# Patient Record
Sex: Male | Born: 1960 | Race: White | Hispanic: No | Marital: Married | Smoking: Current every day smoker
Health system: Southern US, Community
[De-identification: ages and names within clinical notes are randomized; demographics above are authoritative.]

## PROBLEM LIST (undated history)

## (undated) DIAGNOSIS — K219 Gastro-esophageal reflux disease without esophagitis: Secondary | ICD-10-CM

## (undated) DIAGNOSIS — I4510 Unspecified right bundle-branch block: Secondary | ICD-10-CM

## (undated) DIAGNOSIS — R112 Nausea with vomiting, unspecified: Secondary | ICD-10-CM

## (undated) DIAGNOSIS — G4733 Obstructive sleep apnea (adult) (pediatric): Secondary | ICD-10-CM

## (undated) DIAGNOSIS — E291 Testicular hypofunction: Secondary | ICD-10-CM

## (undated) DIAGNOSIS — J449 Chronic obstructive pulmonary disease, unspecified: Secondary | ICD-10-CM

## (undated) DIAGNOSIS — D126 Benign neoplasm of colon, unspecified: Secondary | ICD-10-CM

## (undated) DIAGNOSIS — M1990 Unspecified osteoarthritis, unspecified site: Secondary | ICD-10-CM

## (undated) DIAGNOSIS — Z833 Family history of diabetes mellitus: Secondary | ICD-10-CM

## (undated) DIAGNOSIS — D1391 Familial adenomatous polyposis: Secondary | ICD-10-CM

## (undated) DIAGNOSIS — N210 Calculus in bladder: Secondary | ICD-10-CM

## (undated) DIAGNOSIS — E119 Type 2 diabetes mellitus without complications: Secondary | ICD-10-CM

## (undated) DIAGNOSIS — E785 Hyperlipidemia, unspecified: Secondary | ICD-10-CM

## (undated) DIAGNOSIS — Z87898 Personal history of other specified conditions: Secondary | ICD-10-CM

## (undated) DIAGNOSIS — F419 Anxiety disorder, unspecified: Secondary | ICD-10-CM

## (undated) DIAGNOSIS — N401 Enlarged prostate with lower urinary tract symptoms: Secondary | ICD-10-CM

## (undated) DIAGNOSIS — G5603 Carpal tunnel syndrome, bilateral upper limbs: Secondary | ICD-10-CM

## (undated) DIAGNOSIS — Z9289 Personal history of other medical treatment: Secondary | ICD-10-CM

## (undated) DIAGNOSIS — K439 Ventral hernia without obstruction or gangrene: Secondary | ICD-10-CM

## (undated) DIAGNOSIS — R3912 Poor urinary stream: Secondary | ICD-10-CM

## (undated) DIAGNOSIS — Z9889 Other specified postprocedural states: Secondary | ICD-10-CM

## (undated) DIAGNOSIS — I2510 Atherosclerotic heart disease of native coronary artery without angina pectoris: Secondary | ICD-10-CM

## (undated) DIAGNOSIS — M720 Palmar fascial fibromatosis [Dupuytren]: Secondary | ICD-10-CM

## (undated) HISTORY — PX: TONSILLECTOMY: SUR1361

## (undated) HISTORY — DX: Ventral hernia without obstruction or gangrene: K43.9

## (undated) HISTORY — PX: SEPTOPLASTY: SHX2393

## (undated) HISTORY — DX: Family history of diabetes mellitus: Z83.3

## (undated) HISTORY — PX: COLON SURGERY: SHX602

## (undated) HISTORY — PX: APPENDECTOMY: SHX54

## (undated) HISTORY — PX: LAPAROSCOPIC SUBTOTAL COLECTOMY: SHX5930

## (undated) HISTORY — PX: CARDIAC CATHETERIZATION: SHX172

## (undated) HISTORY — PX: ROTATOR CUFF REPAIR: SHX139

---

## 1999-01-30 ENCOUNTER — Encounter: Payer: Self-pay | Admitting: Internal Medicine

## 1999-01-30 ENCOUNTER — Ambulatory Visit (HOSPITAL_COMMUNITY): Admission: RE | Admit: 1999-01-30 | Discharge: 1999-01-30 | Payer: Self-pay | Admitting: Internal Medicine

## 1999-03-09 ENCOUNTER — Ambulatory Visit (HOSPITAL_BASED_OUTPATIENT_CLINIC_OR_DEPARTMENT_OTHER): Admission: RE | Admit: 1999-03-09 | Discharge: 1999-03-09 | Payer: Self-pay | Admitting: Otolaryngology

## 1999-12-14 ENCOUNTER — Inpatient Hospital Stay (HOSPITAL_COMMUNITY): Admission: EM | Admit: 1999-12-14 | Discharge: 1999-12-14 | Payer: Self-pay | Admitting: Emergency Medicine

## 1999-12-14 ENCOUNTER — Encounter: Payer: Self-pay | Admitting: Emergency Medicine

## 1999-12-14 ENCOUNTER — Encounter: Payer: Self-pay | Admitting: *Deleted

## 2000-01-29 ENCOUNTER — Other Ambulatory Visit: Admission: RE | Admit: 2000-01-29 | Discharge: 2000-01-29 | Payer: Self-pay | Admitting: Internal Medicine

## 2000-01-29 ENCOUNTER — Encounter (INDEPENDENT_AMBULATORY_CARE_PROVIDER_SITE_OTHER): Payer: Self-pay | Admitting: Specialist

## 2000-09-18 ENCOUNTER — Observation Stay (HOSPITAL_COMMUNITY): Admission: RE | Admit: 2000-09-18 | Discharge: 2000-09-19 | Payer: Self-pay | Admitting: Specialist

## 2002-11-26 ENCOUNTER — Inpatient Hospital Stay (HOSPITAL_COMMUNITY): Admission: RE | Admit: 2002-11-26 | Discharge: 2002-11-27 | Payer: Self-pay | Admitting: Specialist

## 2004-05-16 ENCOUNTER — Ambulatory Visit (HOSPITAL_BASED_OUTPATIENT_CLINIC_OR_DEPARTMENT_OTHER): Admission: RE | Admit: 2004-05-16 | Discharge: 2004-05-16 | Payer: Self-pay | Admitting: Pulmonary Disease

## 2004-06-09 ENCOUNTER — Ambulatory Visit: Payer: Self-pay | Admitting: Pulmonary Disease

## 2004-12-11 ENCOUNTER — Ambulatory Visit: Payer: Self-pay | Admitting: Internal Medicine

## 2004-12-13 ENCOUNTER — Ambulatory Visit: Payer: Self-pay | Admitting: Cardiology

## 2004-12-15 ENCOUNTER — Ambulatory Visit: Payer: Self-pay | Admitting: Internal Medicine

## 2005-01-10 ENCOUNTER — Ambulatory Visit: Payer: Self-pay | Admitting: Internal Medicine

## 2005-01-15 ENCOUNTER — Encounter: Admission: RE | Admit: 2005-01-15 | Discharge: 2005-04-15 | Payer: Self-pay | Admitting: Internal Medicine

## 2005-01-22 ENCOUNTER — Ambulatory Visit: Payer: Self-pay | Admitting: Internal Medicine

## 2005-02-20 ENCOUNTER — Ambulatory Visit: Payer: Self-pay | Admitting: Internal Medicine

## 2005-10-16 ENCOUNTER — Ambulatory Visit: Payer: Self-pay | Admitting: Internal Medicine

## 2007-03-31 ENCOUNTER — Ambulatory Visit: Payer: Self-pay | Admitting: Internal Medicine

## 2007-03-31 LAB — CONVERTED CEMR LAB
ALT: 30 units/L (ref 0–53)
AST: 21 units/L (ref 0–37)
Albumin: 3.9 g/dL (ref 3.5–5.2)
Alkaline Phosphatase: 102 units/L (ref 39–117)
BUN: 17 mg/dL (ref 6–23)
Basophils Absolute: 0 10*3/uL (ref 0.0–0.1)
Basophils Relative: 0.4 % (ref 0.0–1.0)
Bilirubin Urine: NEGATIVE
Bilirubin, Direct: 0.1 mg/dL (ref 0.0–0.3)
CO2: 30 meq/L (ref 19–32)
Calcium: 9.2 mg/dL (ref 8.4–10.5)
Chloride: 105 meq/L (ref 96–112)
Cholesterol: 184 mg/dL (ref 0–200)
Creatinine, Ser: 0.8 mg/dL (ref 0.4–1.5)
Eosinophils Absolute: 0.2 10*3/uL (ref 0.0–0.6)
Eosinophils Relative: 2.2 % (ref 0.0–5.0)
GFR calc Af Amer: 133 mL/min
GFR calc non Af Amer: 110 mL/min
Glucose, Bld: 228 mg/dL — ABNORMAL HIGH (ref 70–99)
HCT: 45.5 % (ref 39.0–52.0)
HDL: 20.5 mg/dL — ABNORMAL LOW (ref >39.0)
Hemoglobin, Urine: NEGATIVE
Hemoglobin: 15.5 g/dL (ref 13.0–17.0)
Ketones, ur: NEGATIVE mg/dL
LDL Cholesterol: 133 mg/dL — ABNORMAL HIGH (ref 0–99)
Leukocytes, UA: NEGATIVE
Lymphocytes Relative: 25.7 % (ref 12.0–46.0)
MCHC: 34 g/dL (ref 30.0–36.0)
MCV: 82.9 fL (ref 78.0–100.0)
Monocytes Absolute: 0.9 10*3/uL — ABNORMAL HIGH (ref 0.2–0.7)
Monocytes Relative: 8.2 % (ref 3.0–11.0)
Neutro Abs: 7.1 10*3/uL (ref 1.4–7.7)
Neutrophils Relative %: 63.5 % (ref 43.0–77.0)
Nitrite: NEGATIVE
PSA: 0.56 ng/mL (ref 0.10–4.00)
Platelets: 235 10*3/uL (ref 150–400)
Potassium: 4.5 meq/L (ref 3.5–5.1)
RBC: 5.49 M/uL (ref 4.22–5.81)
RDW: 12.8 % (ref 11.5–14.6)
Sodium: 140 meq/L (ref 135–145)
Specific Gravity, Urine: 1.025 (ref 1.000–1.03)
TSH: 1.9 microintl units/mL (ref 0.35–5.50)
Total Bilirubin: 0.7 mg/dL (ref 0.3–1.2)
Total CHOL/HDL Ratio: 9
Total Protein, Urine: NEGATIVE mg/dL
Total Protein: 6.9 g/dL (ref 6.0–8.3)
Triglycerides: 155 mg/dL — ABNORMAL HIGH (ref 0–149)
Urine Glucose: >=1000 mg/dL — CR
Urobilinogen, UA: 0.2 (ref 0.0–1.0)
VLDL: 31 mg/dL (ref 0–40)
WBC: 11 10*3/uL — ABNORMAL HIGH (ref 4.5–10.5)
pH: 6 (ref 5.0–8.0)

## 2007-04-08 ENCOUNTER — Ambulatory Visit: Payer: Self-pay | Admitting: Internal Medicine

## 2007-04-11 ENCOUNTER — Ambulatory Visit: Payer: Self-pay | Admitting: Cardiology

## 2007-04-17 ENCOUNTER — Ambulatory Visit: Payer: Self-pay

## 2007-06-11 ENCOUNTER — Encounter: Payer: Self-pay | Admitting: *Deleted

## 2007-06-11 DIAGNOSIS — I2510 Atherosclerotic heart disease of native coronary artery without angina pectoris: Secondary | ICD-10-CM | POA: Insufficient documentation

## 2007-06-11 DIAGNOSIS — Z9889 Other specified postprocedural states: Secondary | ICD-10-CM | POA: Insufficient documentation

## 2007-06-11 DIAGNOSIS — E785 Hyperlipidemia, unspecified: Secondary | ICD-10-CM | POA: Insufficient documentation

## 2007-06-11 DIAGNOSIS — K219 Gastro-esophageal reflux disease without esophagitis: Secondary | ICD-10-CM | POA: Insufficient documentation

## 2007-06-11 DIAGNOSIS — E291 Testicular hypofunction: Secondary | ICD-10-CM | POA: Insufficient documentation

## 2007-06-11 DIAGNOSIS — Z9089 Acquired absence of other organs: Secondary | ICD-10-CM | POA: Insufficient documentation

## 2007-06-11 DIAGNOSIS — Z8719 Personal history of other diseases of the digestive system: Secondary | ICD-10-CM | POA: Insufficient documentation

## 2007-07-07 ENCOUNTER — Ambulatory Visit: Payer: Self-pay | Admitting: Internal Medicine

## 2007-07-07 DIAGNOSIS — E119 Type 2 diabetes mellitus without complications: Secondary | ICD-10-CM | POA: Insufficient documentation

## 2007-09-30 ENCOUNTER — Ambulatory Visit: Payer: Self-pay | Admitting: Internal Medicine

## 2007-09-30 DIAGNOSIS — F17200 Nicotine dependence, unspecified, uncomplicated: Secondary | ICD-10-CM | POA: Insufficient documentation

## 2007-10-01 LAB — CONVERTED CEMR LAB
ALT: 30 units/L (ref 0–53)
AST: 17 units/L (ref 0–37)
Albumin: 3.8 g/dL (ref 3.5–5.2)
Alkaline Phosphatase: 77 units/L (ref 39–117)
BUN: 16 mg/dL (ref 6–23)
Basophils Absolute: 0.1 10*3/uL (ref 0.0–0.1)
Basophils Relative: 0.6 % (ref 0.0–1.0)
Bilirubin, Direct: 0.1 mg/dL (ref 0.0–0.3)
CO2: 28 meq/L (ref 19–32)
Calcium: 8.8 mg/dL (ref 8.4–10.5)
Chloride: 104 meq/L (ref 96–112)
Cholesterol: 144 mg/dL (ref 0–200)
Creatinine, Ser: 0.7 mg/dL (ref 0.4–1.5)
Eosinophils Absolute: 0.2 10*3/uL (ref 0.0–0.6)
Eosinophils Relative: 1.9 % (ref 0.0–5.0)
GFR calc Af Amer: 155 mL/min
GFR calc non Af Amer: 128 mL/min
Glucose, Bld: 168 mg/dL — ABNORMAL HIGH (ref 70–99)
HCT: 44.3 % (ref 39.0–52.0)
HDL: 25 mg/dL — ABNORMAL LOW (ref >39.0)
Hemoglobin: 14.8 g/dL (ref 13.0–17.0)
Hgb A1c MFr Bld: 7.1 % — ABNORMAL HIGH (ref 4.6–6.0)
LDL Cholesterol: 101 mg/dL — ABNORMAL HIGH (ref 0–99)
Lymphocytes Relative: 27.1 % (ref 12.0–46.0)
MCHC: 33.5 g/dL (ref 30.0–36.0)
MCV: 85.2 fL (ref 78.0–100.0)
Monocytes Absolute: 0.6 10*3/uL (ref 0.2–0.7)
Monocytes Relative: 6.4 % (ref 3.0–11.0)
Neutro Abs: 6 10*3/uL (ref 1.4–7.7)
Neutrophils Relative %: 64 % (ref 43.0–77.0)
PSA: 0.49 ng/mL (ref 0.10–4.00)
Platelets: 182 10*3/uL (ref 150–400)
Potassium: 4.6 meq/L (ref 3.5–5.1)
RBC: 5.2 M/uL (ref 4.22–5.81)
RDW: 12.4 % (ref 11.5–14.6)
Sodium: 138 meq/L (ref 135–145)
TSH: 1.49 microintl units/mL (ref 0.35–5.50)
Testosterone: 213.77 ng/dL — ABNORMAL LOW (ref 350.00–890)
Total Bilirubin: 0.5 mg/dL (ref 0.3–1.2)
Total CHOL/HDL Ratio: 5.8
Total Protein: 6.6 g/dL (ref 6.0–8.3)
Triglycerides: 88 mg/dL (ref 0–149)
VLDL: 18 mg/dL (ref 0–40)
WBC: 9.5 10*3/uL (ref 4.5–10.5)

## 2008-03-25 ENCOUNTER — Ambulatory Visit: Payer: Self-pay | Admitting: Internal Medicine

## 2008-03-25 LAB — CONVERTED CEMR LAB
BUN: 17 mg/dL (ref 6–23)
CO2: 28 meq/L (ref 19–32)
GFR calc Af Amer: 116 mL/min
Glucose, Bld: 161 mg/dL — ABNORMAL HIGH (ref 70–99)
Potassium: 4.3 meq/L (ref 3.5–5.1)
Sodium: 139 meq/L (ref 135–145)

## 2008-03-31 ENCOUNTER — Ambulatory Visit: Payer: Self-pay | Admitting: Internal Medicine

## 2008-03-31 DIAGNOSIS — K439 Ventral hernia without obstruction or gangrene: Secondary | ICD-10-CM | POA: Insufficient documentation

## 2008-04-21 ENCOUNTER — Telehealth: Payer: Self-pay | Admitting: Internal Medicine

## 2008-04-29 ENCOUNTER — Telehealth: Payer: Self-pay | Admitting: Internal Medicine

## 2008-04-30 ENCOUNTER — Telehealth: Payer: Self-pay | Admitting: Internal Medicine

## 2008-06-09 ENCOUNTER — Telehealth: Payer: Self-pay | Admitting: Internal Medicine

## 2008-11-04 ENCOUNTER — Encounter: Payer: Self-pay | Admitting: Internal Medicine

## 2009-03-19 DIAGNOSIS — F411 Generalized anxiety disorder: Secondary | ICD-10-CM | POA: Insufficient documentation

## 2009-04-19 ENCOUNTER — Encounter: Payer: Self-pay | Admitting: Internal Medicine

## 2009-07-12 ENCOUNTER — Telehealth (INDEPENDENT_AMBULATORY_CARE_PROVIDER_SITE_OTHER): Payer: Self-pay | Admitting: *Deleted

## 2009-07-22 ENCOUNTER — Telehealth: Payer: Self-pay | Admitting: Internal Medicine

## 2009-07-25 ENCOUNTER — Ambulatory Visit: Payer: Self-pay | Admitting: Internal Medicine

## 2009-07-26 LAB — CONVERTED CEMR LAB
ALT: 24 units/L (ref 0–53)
Alkaline Phosphatase: 81 units/L (ref 39–117)
BUN: 18 mg/dL (ref 6–23)
Basophils Relative: 0.3 % (ref 0.0–3.0)
Bilirubin, Direct: 0.1 mg/dL (ref 0.0–0.3)
Calcium: 9 mg/dL (ref 8.4–10.5)
Cholesterol: 127 mg/dL (ref 0–200)
Creatinine, Ser: 0.9 mg/dL (ref 0.4–1.5)
Eosinophils Relative: 0.9 % (ref 0.0–5.0)
GFR calc non Af Amer: 95.12 mL/min (ref >60)
HDL: 27.2 mg/dL — ABNORMAL LOW (ref >39.00)
Ketones, ur: NEGATIVE mg/dL
LDL Cholesterol: 85 mg/dL (ref 0–99)
Leukocytes, UA: NEGATIVE
Lymphocytes Relative: 31.2 % (ref 12.0–46.0)
MCV: 87.4 fL (ref 78.0–100.0)
Monocytes Absolute: 0.6 10*3/uL (ref 0.1–1.0)
Neutrophils Relative %: 60.8 % (ref 43.0–77.0)
Nitrite: NEGATIVE
PSA: 0.6 ng/mL (ref 0.10–4.00)
Platelets: 177 10*3/uL (ref 150.0–400.0)
RBC: 5.18 M/uL (ref 4.22–5.81)
Specific Gravity, Urine: >=1.03 (ref 1.000–1.030)
Total Bilirubin: 0.9 mg/dL (ref 0.3–1.2)
Total CHOL/HDL Ratio: 5
Total Protein, Urine: NEGATIVE mg/dL
Triglycerides: 76 mg/dL (ref 0.0–149.0)
VLDL: 15.2 mg/dL (ref 0.0–40.0)
WBC: 9.2 10*3/uL (ref 4.5–10.5)
pH: 5 (ref 5.0–8.0)

## 2009-08-02 ENCOUNTER — Ambulatory Visit: Payer: Self-pay | Admitting: Internal Medicine

## 2009-08-13 ENCOUNTER — Encounter: Payer: Self-pay | Admitting: Internal Medicine

## 2009-11-23 ENCOUNTER — Ambulatory Visit: Payer: Self-pay | Admitting: Internal Medicine

## 2009-11-23 LAB — CONVERTED CEMR LAB
Chloride: 106 meq/L (ref 96–112)
GFR calc non Af Amer: 126.96 mL/min (ref >60)
Hgb A1c MFr Bld: 6.9 % — ABNORMAL HIGH (ref 4.6–6.5)
Potassium: 4.5 meq/L (ref 3.5–5.1)
Sodium: 141 meq/L (ref 135–145)

## 2009-11-30 ENCOUNTER — Ambulatory Visit: Payer: Self-pay | Admitting: Internal Medicine

## 2009-11-30 DIAGNOSIS — E669 Obesity, unspecified: Secondary | ICD-10-CM | POA: Insufficient documentation

## 2009-12-21 ENCOUNTER — Encounter: Payer: Self-pay | Admitting: Internal Medicine

## 2010-05-11 ENCOUNTER — Encounter: Admission: RE | Admit: 2010-05-11 | Discharge: 2010-05-11 | Payer: Self-pay | Admitting: Specialist

## 2010-07-03 ENCOUNTER — Telehealth: Payer: Self-pay | Admitting: Internal Medicine

## 2010-07-04 ENCOUNTER — Telehealth (INDEPENDENT_AMBULATORY_CARE_PROVIDER_SITE_OTHER): Payer: Self-pay | Admitting: *Deleted

## 2010-08-02 ENCOUNTER — Ambulatory Visit: Payer: Self-pay | Admitting: Internal Medicine

## 2010-08-09 ENCOUNTER — Other Ambulatory Visit: Payer: Self-pay | Admitting: Internal Medicine

## 2010-08-09 ENCOUNTER — Ambulatory Visit
Admission: RE | Admit: 2010-08-09 | Discharge: 2010-08-09 | Payer: Self-pay | Source: Home / Self Care | Attending: Internal Medicine | Admitting: Internal Medicine

## 2010-08-09 ENCOUNTER — Encounter: Payer: Self-pay | Admitting: Internal Medicine

## 2010-08-09 DIAGNOSIS — D485 Neoplasm of uncertain behavior of skin: Secondary | ICD-10-CM | POA: Insufficient documentation

## 2010-08-09 DIAGNOSIS — J019 Acute sinusitis, unspecified: Secondary | ICD-10-CM | POA: Insufficient documentation

## 2010-08-09 DIAGNOSIS — R0989 Other specified symptoms and signs involving the circulatory and respiratory systems: Secondary | ICD-10-CM | POA: Insufficient documentation

## 2010-08-09 LAB — BASIC METABOLIC PANEL
BUN: 14 mg/dL (ref 6–23)
CO2: 29 mEq/L (ref 19–32)
Calcium: 9 mg/dL (ref 8.4–10.5)
Chloride: 102 mEq/L (ref 96–112)
Creatinine, Ser: 0.6 mg/dL (ref 0.4–1.5)
GFR: 167.21 mL/min (ref >60.00)
Glucose, Bld: 84 mg/dL (ref 70–99)
Potassium: 4.1 mEq/L (ref 3.5–5.1)
Sodium: 137 mEq/L (ref 135–145)

## 2010-08-09 LAB — CBC WITH DIFFERENTIAL/PLATELET
Basophils Absolute: 0 10*3/uL (ref 0.0–0.1)
Basophils Relative: 0.4 % (ref 0.0–3.0)
Eosinophils Absolute: 0.2 10*3/uL (ref 0.0–0.7)
Eosinophils Relative: 1.7 % (ref 0.0–5.0)
HCT: 44.1 % (ref 39.0–52.0)
Hemoglobin: 15.3 g/dL (ref 13.0–17.0)
Lymphocytes Relative: 25.6 % (ref 12.0–46.0)
Lymphs Abs: 3.1 10*3/uL (ref 0.7–4.0)
MCHC: 34.6 g/dL (ref 30.0–36.0)
MCV: 85.2 fl (ref 78.0–100.0)
Monocytes Absolute: 0.8 10*3/uL (ref 0.1–1.0)
Monocytes Relative: 6.5 % (ref 3.0–12.0)
Neutro Abs: 8.1 10*3/uL — ABNORMAL HIGH (ref 1.4–7.7)
Neutrophils Relative %: 65.8 % (ref 43.0–77.0)
Platelets: 235 10*3/uL (ref 150.0–400.0)
RBC: 5.18 Mil/uL (ref 4.22–5.81)
RDW: 13.1 % (ref 11.5–14.6)
WBC: 12.3 10*3/uL — ABNORMAL HIGH (ref 4.5–10.5)

## 2010-08-09 LAB — HEMOGLOBIN A1C: Hgb A1c MFr Bld: 6.5 % (ref 4.6–6.5)

## 2010-08-09 LAB — URINALYSIS
Bilirubin Urine: NEGATIVE
Hemoglobin, Urine: NEGATIVE
Ketones, ur: NEGATIVE
Leukocytes, UA: NEGATIVE
Nitrite: NEGATIVE
Specific Gravity, Urine: 1.02 (ref 1.000–1.030)
Total Protein, Urine: NEGATIVE
Urine Glucose: NEGATIVE
Urobilinogen, UA: 0.2 (ref 0.0–1.0)
pH: 6 (ref 5.0–8.0)

## 2010-08-09 LAB — VITAMIN B12: Vitamin B-12: 359 pg/mL (ref 211–911)

## 2010-08-09 LAB — TSH: TSH: 1.74 u[IU]/mL (ref 0.35–5.50)

## 2010-08-09 LAB — HEPATIC FUNCTION PANEL
ALT: 24 U/L (ref 0–53)
AST: 18 U/L (ref 0–37)
Albumin: 4.2 g/dL (ref 3.5–5.2)
Alkaline Phosphatase: 92 U/L (ref 39–117)
Bilirubin, Direct: 0.1 mg/dL (ref 0.0–0.3)
Total Bilirubin: 0.6 mg/dL (ref 0.3–1.2)
Total Protein: 7.2 g/dL (ref 6.0–8.3)

## 2010-08-09 LAB — LIPID PANEL
Cholesterol: 188 mg/dL (ref 0–200)
HDL: 27.5 mg/dL — ABNORMAL LOW (ref >39.00)
LDL Cholesterol: 134 mg/dL — ABNORMAL HIGH (ref 0–99)
Total CHOL/HDL Ratio: 7
Triglycerides: 131 mg/dL (ref 0.0–149.0)
VLDL: 26.2 mg/dL (ref 0.0–40.0)

## 2010-08-09 LAB — PSA: PSA: 0.8 ng/mL (ref 0.10–4.00)

## 2010-08-11 ENCOUNTER — Encounter (INDEPENDENT_AMBULATORY_CARE_PROVIDER_SITE_OTHER): Payer: Self-pay | Admitting: *Deleted

## 2010-08-17 ENCOUNTER — Encounter: Payer: Self-pay | Admitting: Internal Medicine

## 2010-08-18 ENCOUNTER — Ambulatory Visit: Admission: RE | Admit: 2010-08-18 | Discharge: 2010-08-18 | Payer: Self-pay | Source: Home / Self Care

## 2010-08-18 ENCOUNTER — Encounter: Payer: Self-pay | Admitting: Internal Medicine

## 2010-08-30 ENCOUNTER — Other Ambulatory Visit: Payer: Self-pay | Admitting: Internal Medicine

## 2010-08-30 ENCOUNTER — Ambulatory Visit
Admission: RE | Admit: 2010-08-30 | Discharge: 2010-08-30 | Payer: Self-pay | Source: Home / Self Care | Attending: Internal Medicine | Admitting: Internal Medicine

## 2010-08-30 DIAGNOSIS — H6690 Otitis media, unspecified, unspecified ear: Secondary | ICD-10-CM | POA: Insufficient documentation

## 2010-09-05 NOTE — Letter (Signed)
Summary: [facility]   Imported By: Lester [place] 08/25/2009 07:29:20  _____________________________________________________________________  External Attachment:    Type:   Image     Comment:   External Document

## 2010-09-05 NOTE — Assessment & Plan Note (Signed)
Summary: 4 MTH FU--STC   Vital Signs:  Patient profile:   50 year old male Height:      71 inches Weight:      203.50 pounds BMI:     28.49 O2 Sat:      95 % on Room air Temp:     98.4 degrees F oral Pulse rate:   63 / minute BP sitting:   122 / 64  (left arm) Cuff size:   regular  Vitals Entered By: Lucious Groves (November 30, 2009 4:49 PM)  O2 Flow:  Room air  History of Present Illness: The patient presents for a follow up of hypertension, diabetes, hyperlipidemia Loosing wt on [employer] program: lost 16 lbs  Current Medications (verified): 1)  Lorazepam 1 Mg  Tabs (Lorazepam) .... Take 2 Tablets When Needed 2)  Metformin Hcl 500 Mg  Tabs (Metformin Hcl) .... Take One Tablet Twice Daily 3)  Lovastatin 40 Mg Tabs (Lovastatin) .Marland Kitchen.. 1 Po Qd 4)  Aspirin 81 Mg  Tabs (Aspirin) .... Take One Tablet Once Daily 5)  Ranitidine Hcl 300 Mg Tabs (Ranitidine Hcl) .... Take 1 Tablet By Mouth Once A Day 6)  Terazosin Hcl 2 Mg Caps (Terazosin Hcl) .Marland Kitchen.. 1 By Mouth At Bedtime For Prostate 7)  Chantix Starting Month Pak 0.5 Mg X 11 & 1 Mg X 42 Tabs (Varenicline Tartrate) .... As Dirrected 8)  Chantix Continuing Month Pak 1 Mg Tabs (Varenicline Tartrate) .... As Dirrected 9)  Mucinex (Otc) .Marland Kitchen.. 1 - 2 By Mouth Daily  Allergies (verified): 1)  ! Codeine 2)  ! * Ct Contrast  Past History:  Past Medical History: Last updated: 03/19/2009 ANXIETY (ICD-300.00) UNSPEC VENTRAL HERN W/O MENTION OBST/GANGREN (ICD-553.20) DIABETES MELLITUS, TYPE II (ICD-250.00) CORONARY ARTERY DISEASE (ICD-414.00) GERD (ICD-530.81) BARRETT'S ESOPHAGUS, HX OF (ICD-V12.79) HYPERLIPIDEMIA (ICD-272.4) HYPOGONADISM (ICD-257.2) FAMILY HISTORY DIABETES 1ST DEGREE RELATIVE (ICD-V18.0) TOBACCO USE DISORDER/SMOKER-SMOKING CESSATION DISCUSSED (ICD-305.1)    Social History: Last updated: 08/02/2009 Occupation: school system Married Current Smoker  Review of Systems  The patient denies fever, chest pain, syncope,  dyspnea on exertion, prolonged cough, and headaches.         Better  Physical Exam  General:  well-developed and less overweight-appearing.   Nose:  External nasal examination shows no deformity or inflammation. Nasal mucosa are pink and moist without lesions or exudates. Mouth:  Oral mucosa and oropharynx without lesions or exudates.  Teeth in good repair. Lungs:  Normal respiratory effort, chest expands symmetrically. Lungs are clear to auscultation, no crackles or wheezes. Heart:  Normal rate and regular rhythm. S1 and S2 normal without gallop, murmur, click, rub or other extra sounds. Abdomen:  Upper midline hernia Msk:  No deformity or scoliosis noted of thoracic or lumbar spine.   Neurologic:  No cranial nerve deficits noted. Station and gait are normal. Plantar reflexes are down-going bilaterally. DTRs are symmetrical throughout. Sensory, motor and coordinative functions appear intact. Skin:  Intact without suspicious lesions or rashes Psych:  Cognition and judgment appear intact. Alert and cooperative with normal attention span and concentration. No apparent delusions, illusions, hallucinations   Impression & Recommendations:  Problem # 1:  DIABETES MELLITUS, TYPE II (ICD-250.00) Assessment Improved  His updated medication list for this problem includes:    Metformin Hcl 500 Mg Tabs (Metformin hcl) .Marland Kitchen... Take one tablet twice daily    Aspirin 81 Mg Tabs (Aspirin) .Marland Kitchen... Take one tablet once daily  Problem # 2:  CORONARY ARTERY DISEASE (ICD-414.00) Assessment: Unchanged  His  updated medication list for this problem includes:    Aspirin 81 Mg Tabs (Aspirin) .Marland Kitchen... Take one tablet once daily    Terazosin Hcl 2 Mg Caps (Terazosin hcl) .Marland Kitchen... 1 by mouth at bedtime for prostate  Problem # 3:  HYPERLIPIDEMIA (ICD-272.4) Assessment: Improved  His updated medication list for this problem includes:    Lovastatin 40 Mg Tabs (Lovastatin) .Marland Kitchen... 1 po qd  Problem # 4:  ANXIETY  (ICD-300.00) Assessment: Improved  His updated medication list for this problem includes:    Lorazepam 1 Mg Tabs (Lorazepam) .Marland Kitchen... Take 2 tablets when needed  Problem # 5:  OBESITY (ICD-278.00) Assessment: Improved On diet  Complete Medication List: 1)  Lorazepam 1 Mg Tabs (Lorazepam) .... Take 2 tablets when needed 2)  Metformin Hcl 500 Mg Tabs (Metformin hcl) .... Take one tablet twice daily 3)  Lovastatin 40 Mg Tabs (Lovastatin) .Marland Kitchen.. 1 po qd 4)  Aspirin 81 Mg Tabs (Aspirin) .... Take one tablet once daily 5)  Ranitidine Hcl 300 Mg Tabs (Ranitidine hcl) .... Take 1 tablet by mouth once a day 6)  Terazosin Hcl 2 Mg Caps (Terazosin hcl) .Marland Kitchen.. 1 by mouth at bedtime for prostate 7)  Chantix Starting Month Pak 0.5 Mg X 11 & 1 Mg X 42 Tabs (Varenicline tartrate) .... As dirrected 8)  Chantix Continuing Month Pak 1 Mg Tabs (Varenicline tartrate) .... As dirrected 9)  Mucinex (otc)  .Marland Kitchen.. 1 - 2 by mouth daily  Patient Instructions: 1)  Please schedule a follow-up appointment in 4 months. 2)  BMP prior to visit, ICD-9: 3)  Hepatic Panel prior to visit, ICD-9: 4)  TSH prior to visit, ICD-9: 250.00 414.8 5)  CBC w/ Diff prior to visit, ICD-9: 6)  HbgA1C prior to visit, ICD-9:

## 2010-09-05 NOTE — Progress Notes (Signed)
Summary: RESCHEDULE APT  Phone Note Call from Patient Call back at Work Phone [phone]   Summary of Call: Pt left vm req labs prior to next apt which he thinks is a physical. Please help reschedule pt b/c he is due for cpx w/labs prior. Last cpx was 08/02/09. Thank you Initial call taken by: Lamar Sprinkles, CMA,  July 04, 2010 5:24 PM  Follow-up for Phone Call        Appt set up for Jan 2012 for CPX. Follow-up by: Verdell Face,  July 05, 2010 9:48 AM

## 2010-09-05 NOTE — Letter (Signed)
Summary: Tobacco Cessation [facility]  Tobacco Cessation [facility]   Imported By: Sherian Rein 12/26/2009 08:06:25  _____________________________________________________________________  External Attachment:    Type:   Image     Comment:   External Document

## 2010-09-05 NOTE — Progress Notes (Signed)
Summary: Rf Lorazepam  Phone Note Refill Request Message from:  Fax from Pharmacy  Refills Requested: Medication #1:  LORAZEPAM 1 MG  TABS Take 2 tablets when needed   Dosage confirmed as above?Dosage Confirmed   Supply Requested: 60   Last Refilled: 12/22/2009  Method Requested: Telephone to Pharmacy Next Appointment Scheduled: none Initial call taken by: Lanier Prude, [facility]),  July 03, 2010 10:59 AM  Follow-up for Phone Call        ok 3 ref Follow-up by: Tresa Garter MD,  July 03, 2010 5:58 PM    Prescriptions: LORAZEPAM 1 MG  TABS (LORAZEPAM) Take 2 tablets when needed  #60 x 3   Entered by:   Lamar Sprinkles, CMA   Authorized by:   Tresa Garter MD   Signed by:   Lamar Sprinkles, CMA on 07/03/2010   Method used:   Telephoned to ...       [facility]* (retail)       [address] [place], [place]  [address]       Ph: [phone] or [phone]       Fax: [phone]   RxID:   [id number]

## 2010-09-07 NOTE — Assessment & Plan Note (Signed)
Summary: SKIN BX/NWS  #   Vital Signs:  Patient profile:   50 year old male Height:      71 inches Weight:      209 pounds BMI:     29.25 O2 Sat:      96 % on Room air Temp:     98.6 degrees F oral Pulse rate:   69 / minute BP sitting:   122 / 82  (left arm) Cuff size:   regular  Vitals Entered By: Bill Salinas CMA (August 30, 2010 2:57 PM)  O2 Flow:  Room air  Procedure Note  Mole Biopsy/Removal: The patient complains of changing mole. Indication: suspicious lesion Consent signed: yes  Procedure # 1: shave biopsy    Size (in cm): 0.4 x 0.5    Region: lateral    Location: L temple    Comment: Risks including but not limited by incomplete procedure, bleeding, infection, recurrence were discussed with the patient. Consent form was signed. Tolerated well. Complicatons - none.     Instrument used: dermablade    Anesthesia: 0.5 ml 1% lidocaine w/epinephrine  Procedure # 2: shave biopsy    Size (in cm): 0.6 x 0.4    Region: lateral    Location: L dist thigh    Comment: as above    Instrument used: same    Anesthesia: 0.5 ml 1% lidocaine w/epinephrine  Cleaned and prepped with: alcohol and betadine Wound dressing: neosporin and bandaid Instructions: daily dressing changes Additional Instructions: Two lesions in L axilla were removed identically at no charge and discarded (tags)  CC: pt here to have moles on his face arm and knee looked at/ ab   CC:  pt here to have moles on his face arm and knee looked at/ ab.  History of Present Illness: Skin biopsy  C/o L earache new-  sinuses are better after abx; but  the ear still feels like underwater  Current Medications (verified): 1)  Lorazepam 1 Mg  Tabs (Lorazepam) .... Take 2 Tablets When Needed 2)  Metformin Hcl 500 Mg  Tabs (Metformin Hcl) .... Take One Tablet Twice Daily 3)  Lovastatin 40 Mg Tabs (Lovastatin) .Marland Kitchen.. 1 Po Qd 4)  Aspirin 81 Mg  Tabs (Aspirin) .... Take One Tablet Once Daily 5)  Ranitidine Hcl 300 Mg  Tabs (Ranitidine Hcl) .... Take 1 Tablet By Mouth Once A Day 6)  Terazosin Hcl 2 Mg Caps (Terazosin Hcl) .Marland Kitchen.. 1 By Mouth At Bedtime For Prostate 7)  Chantix Starting Month Pak 0.5 Mg X 11 & 1 Mg X 42 Tabs (Varenicline Tartrate) .... As Dirrected 8)  Chantix Continuing Month Pak 1 Mg Tabs (Varenicline Tartrate) .... As Dirrected 9)  Mucinex (Otc) .Marland Kitchen.. 1 - 2 By Mouth Daily 10)  Flonase 50 Mcg/act Susp (Fluticasone Propionate) .Marland Kitchen.. 1 Spr Each Nostr Qd As Needed  Allergies (verified): 1)  ! Codeine 2)  ! * Ct Contrast  Past History:  Past Medical History: Last updated: 08/09/2010 ANXIETY (ICD-300.00) UNSPEC VENTRAL HERN W/O MENTION OBST/GANGREN (ICD-553.20) DIABETES MELLITUS, TYPE II (ICD-250.00) CORONARY ARTERY DISEASE (ICD-414.00) Dr Jens Som GERD (ICD-530.81) BARRETT'S ESOPHAGUS, HX OF (ICD-V12.79) HYPERLIPIDEMIA (ICD-272.4) HYPOGONADISM (ICD-257.2) FAMILY HISTORY DIABETES 1ST DEGREE RELATIVE (ICD-V18.0) TOBACCO USE DISORDER/SMOKER-SMOKING CESSATION DISCUSSED (ICD-305.1)    Review of Systems  The patient denies fever.    Physical Exam  General:  well-developed and less overweight-appearing.   Ears:  L TM retracted Mouth:  WNL Lungs:  CTA Heart:  RRR Skin:  4 mm mole L  temple 6x4 mm mole L thigh lat dist 2 growths in L axilla 3x6 mm and 2x3 mm    Impression & Recommendations:  Problem # 1:  NEOPLASM OF UNCERTAIN BEHAVIOR OF SKIN (ICD-238.2) Assessment New  Orders: Shave Skin Lesion < 0.5cm face/ears/eyelids/nose/lips/mm (11310) Shave Skin Lesion 0.6-1.0 cm/trunk/arm/leg (11301)  Problem # 2:  OTITIS MEDIA (ICD-382.9) L Assessment: New  His updated medication list for this problem includes:    Aspirin 81 Mg Tabs (Aspirin) .Marland Kitchen... Take one tablet once daily    Augmentin 875-125 Mg Tabs (Amoxicillin-pot clavulanate) .Marland Kitchen... 1 by mouth bid  Complete Medication List: 1)  Lorazepam 1 Mg Tabs (Lorazepam) .... Take 2 tablets when needed 2)  Metformin Hcl 500 Mg Tabs  (Metformin hcl) .... Take one tablet twice daily 3)  Lovastatin 40 Mg Tabs (Lovastatin) .Marland Kitchen.. 1 po qd 4)  Aspirin 81 Mg Tabs (Aspirin) .... Take one tablet once daily 5)  Ranitidine Hcl 300 Mg Tabs (Ranitidine hcl) .... Take 1 tablet by mouth once a day 6)  Terazosin Hcl 2 Mg Caps (Terazosin hcl) .Marland Kitchen.. 1 by mouth at bedtime for prostate 7)  Chantix Starting Month Pak 0.5 Mg X 11 & 1 Mg X 42 Tabs (Varenicline tartrate) .... As dirrected 8)  Chantix Continuing Month Pak 1 Mg Tabs (Varenicline tartrate) .... As dirrected 9)  Mucinex (otc)  .Marland Kitchen.. 1 - 2 by mouth daily 10)  Flonase 50 Mcg/act Susp (Fluticasone propionate) .Marland Kitchen.. 1 spr each nostr qd as needed 11)  Augmentin 875-125 Mg Tabs (Amoxicillin-pot clavulanate) .Marland Kitchen.. 1 by mouth bid Prescriptions: AUGMENTIN 875-125 MG TABS (AMOXICILLIN-POT CLAVULANATE) 1 by mouth bid  #28 x 0   Entered and Authorized by:   Tresa Garter MD   Signed by:   Tresa Garter MD on 08/30/2010   Method used:   Electronically to        [facility]* (retail)       [address] [place], [place]  [address]       Ph: [phone] or [phone]       Fax: [phone]   RxID:   [id number]    Orders Added: 1)  Shave Skin Lesion < 0.5cm face/ears/eyelids/nose/lips/mm [11310] 2)  Shave Skin Lesion 0.6-1.0 cm/trunk/arm/leg [11301] 3)  Est. Patient Level III [42595]

## 2010-09-07 NOTE — Assessment & Plan Note (Signed)
Summary: cpx-lb   Vital Signs:  Patient profile:   50 year old male Height:      71 inches Weight:      204 pounds BMI:     28.56 Pulse rate:   72 / minute Pulse rhythm:   regular Resp:     16 per minute BP sitting:   120 / 80  (left arm) Cuff size:   regular  Vitals Entered By: Lanier Prude, CMA(AAMA) (August 09, 2010 3:06 PM) CC: CPX Is Patient Diabetic? Yes   CC:  CPX.  History of Present Illness: The patient presents for a preventive health examination  C/o L ear pain x 1 month - it pops. He just finished Augmentin x 10 d  Current Medications (verified): 1)  Lorazepam 1 Mg  Tabs (Lorazepam) .... Take 2 Tablets When Needed 2)  Metformin Hcl 500 Mg  Tabs (Metformin Hcl) .... Take One Tablet Twice Daily 3)  Lovastatin 40 Mg Tabs (Lovastatin) .Marland Kitchen.. 1 Po Qd 4)  Aspirin 81 Mg  Tabs (Aspirin) .... Take One Tablet Once Daily 5)  Ranitidine Hcl 300 Mg Tabs (Ranitidine Hcl) .... Take 1 Tablet By Mouth Once A Day 6)  Terazosin Hcl 2 Mg Caps (Terazosin Hcl) .Marland Kitchen.. 1 By Mouth At Bedtime For Prostate 7)  Chantix Starting Month Pak 0.5 Mg X 11 & 1 Mg X 42 Tabs (Varenicline Tartrate) .... As Dirrected 8)  Chantix Continuing Month Pak 1 Mg Tabs (Varenicline Tartrate) .... As Dirrected 9)  Mucinex (Otc) .Marland Kitchen.. 1 - 2 By Mouth Daily  Allergies (verified): 1)  ! Codeine 2)  ! * Ct Contrast  Past History:  Past Surgical History: Last updated: 06/11/2007 * SEPTOPLASTY APPENDECTOMY, HX OF (ICD-V45.79) TONSILLECTOMY, HX OF (ICD-V45.79) ROTATOR CUFF REPAIR, RIGHT, HX OF (ICD-V45.89) ROTATOR CUFF REPAIR, LEFT, HX OF (ICD-V45.89)  Family History: Last updated: 07/07/2007 Family History Diabetes 1st degree relative  Social History: Last updated: 08/02/2009 Occupation: school system Married Current Smoker  Past Medical History: ANXIETY (ICD-300.00) UNSPEC VENTRAL HERN W/O MENTION OBST/GANGREN (ICD-553.20) DIABETES MELLITUS, TYPE II (ICD-250.00) CORONARY ARTERY DISEASE  (ICD-414.00) Dr Jens Som GERD (ICD-530.81) BARRETT'S ESOPHAGUS, HX OF (ICD-V12.79) HYPERLIPIDEMIA (ICD-272.4) HYPOGONADISM (ICD-257.2) FAMILY HISTORY DIABETES 1ST DEGREE RELATIVE (ICD-V18.0) TOBACCO USE DISORDER/SMOKER-SMOKING CESSATION DISCUSSED (ICD-305.1)    Review of Systems  The patient denies anorexia, fever, weight loss, weight gain, vision loss, decreased hearing, hoarseness, chest pain, syncope, dyspnea on exertion, peripheral edema, prolonged cough, headaches, hemoptysis, abdominal pain, melena, hematochezia, severe indigestion/heartburn, hematuria, incontinence, genital sores, muscle weakness, suspicious skin lesions, transient blindness, difficulty walking, depression, unusual weight change, abnormal bleeding, enlarged lymph nodes, angioedema, and testicular masses.         on [employer]  Physical Exam  General:  well-developed and less overweight-appearing.   Head:  Normocephalic and atraumatic without obvious abnormalities. No apparent alopecia or balding. Eyes:  No corneal or conjunctival inflammation noted. EOMI. Perrla Ears:  External ear exam shows no significant lesions or deformities.  Otoscopic examination reveals clear canals, tympanic membranes are intact bilaterally without bulging, retraction, inflammation or discharge. Hearing is grossly normal bilaterally. Nose:  External nasal examination shows no deformity or inflammation. Nasal mucosa are pink and moist without lesions or exudates. Mouth:  Oral mucosa and oropharynx without lesions or exudates.  Teeth in good repair. Neck:  No deformities, masses, or tenderness noted. Lungs:  Normal respiratory effort, chest expands symmetrically. Lungs are clear to auscultation, no crackles or wheezes. Heart:  Normal rate and regular rhythm. S1 and S2  normal without gallop, murmur, click, rub or other extra sounds. Abdomen:  Upper midline hernia Rectal:  per GI Prostate:  per GI Msk:  No deformity or scoliosis noted of  thoracic or lumbar spine.   Pulses:  R and L carotid,radial,femoral,dorsalis pedis and posterior tibial pulses are full and equal bilaterally Extremities:  No clubbing, cyanosis, edema, or deformity noted with normal full range of motion of all joints.   Neurologic:  No cranial nerve deficits noted. Station and gait are normal. Plantar reflexes are down-going bilaterally. DTRs are symmetrical throughout. Sensory, motor and coordinative functions appear intact. Skin:  L temple mole 6 mm Cervical Nodes:  No lymphadenopathy noted Inguinal Nodes:  No significant adenopathy Psych:  Cognition and judgment appear intact. Alert and cooperative with normal attention span and concentration. No apparent delusions, illusions, hallucinations   Impression & Recommendations:  Problem # 1:  PHYSICAL EXAMINATION (ICD-V70.0) Assessment New Health and age related issues were discussed. Available screening tests and vaccinations were discussed as well. Healthy life style including good diet and exercise was discussed.  Labs ordered Orders: EKG w/ Interpretation (93000) TLB-BMP (Basic Metabolic Panel-BMET) (80048-METABOL) TLB-CBC Platelet - w/Differential (85025-CBCD) TLB-Hepatic/Liver Function Pnl (80076-HEPATIC) TLB-Lipid Panel (80061-LIPID) TLB-TSH (Thyroid Stimulating Hormone) (84443-TSH) TLB-PSA (Prostate Specific Antigen) (84153-PSA) TLB-Udip ONLY (81003-UDIP)  Problem # 2:  DIABETES MELLITUS, TYPE II (ICD-250.00) Assessment: Improved  His updated medication list for this problem includes:    Metformin Hcl 500 Mg Tabs (Metformin hcl) .Marland Kitchen... Take one tablet twice daily    Aspirin 81 Mg Tabs (Aspirin) .Marland Kitchen... Take one tablet once daily  Orders: TLB-A1C / Hgb A1C (Glycohemoglobin) (83036-A1C) TLB-B12, Serum-Total ONLY ([address]-U04)  Problem # 3:  OBESITY (ICD-278.00) Assessment: Improved on diet  Problem # 4:  CORONARY ARTERY DISEASE (ICD-414.00) Assessment: Unchanged  His updated medication  list for this problem includes:    Aspirin 81 Mg Tabs (Aspirin) .Marland Kitchen... Take one tablet once daily    Terazosin Hcl 2 Mg Caps (Terazosin hcl) .Marland Kitchen... 1 by mouth at bedtime for prostate  Orders: TLB-B12, Serum-Total ONLY (54098-J19)  Problem # 5:  CAROTID BRUIT (ICD-785.9) R Assessment: New  Orders: Doppler Referral (Doppler) Gastroenterology Referral (GI)  Problem # 6:  TOBACCO USE DISORDER/SMOKER-SMOKING CESSATION DISCUSSED (ICD-305.1) Assessment: Unchanged  His updated medication list for this problem includes:    Chantix Starting Month Pak 0.5 Mg X 11 & 1 Mg X 42 Tabs (Varenicline tartrate) .Marland Kitchen... As dirrected    Chantix Continuing Month Pak 1 Mg Tabs (Varenicline tartrate) .Marland Kitchen... As dirrected  Encouraged smoking cessation and discussed different methods for smoking cessation.   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 Take 2 tablets when needed 2)  Metformin Hcl 500 Mg Tabs (Metformin hcl) .... Take one tablet twice daily 3)  Lovastatin 40 Mg Tabs (Lovastatin) .Marland Kitchen.. 1 po qd 4)  Aspirin 81 Mg Tabs (Aspirin) .... Take one tablet once daily 5)  Ranitidine Hcl 300 Mg Tabs (Ranitidine hcl) .... Take 1 tablet by mouth once a day 6)  Terazosin Hcl 2 Mg Caps (Terazosin hcl) .Marland Kitchen.. 1 by mouth at bedtime for prostate 7)  Chantix Starting Month Pak 0.5 Mg X 11 & 1 Mg X 42 Tabs (Varenicline tartrate) .... As dirrected 8)  Chantix Continuing Month Pak 1 Mg Tabs (Varenicline tartrate) .... As dirrected 9)  Mucinex (otc)  .Marland Kitchen.. 1 - 2 by mouth  daily 10)  Augmentin 875-125 Mg Tabs (Amoxicillin-pot clavulanate) .Marland Kitchen.. 1 by mouth bid 11)  Flonase 50 Mcg/act Susp (Fluticasone propionate) .Marland Kitchen.. 1 spr each nostr qd as needed  Patient Instructions: 1)  Use the Sinus rinse as needed  2)  Please schedule a follow-up appointment in 4 months. 3)  BMP prior to visit,  ICD-9: 4)  HbgA1C prior to visit, ICD-9:250.00 Prescriptions: FLONASE 50 MCG/ACT SUSP (FLUTICASONE PROPIONATE) 1 spr each nostr qd as needed  #3 x 3   Entered and Authorized by:   Tresa Garter MD   Signed by:   Tresa Garter MD on 08/09/2010   Method used:   Electronically to        [facility]* (retail)       [address] [place], [place]  [address]       Ph: [phone] or [phone]       Fax: [phone]   RxID:   [id number] AUGMENTIN 875-125 MG TABS (AMOXICILLIN-POT CLAVULANATE) 1 by mouth bid  #20 x 0   Entered and Authorized by:   Tresa Garter MD   Signed by:   Tresa Garter MD on 08/09/2010   Method used:   Electronically to        [facility]* (retail)       [address] [place], [place]  [address]       Ph: [phone] or [phone]       Fax: [phone]   RxID:   [id number]    Orders Added: 1)  EKG w/ Interpretation [93000] 2)  TLB-BMP (Basic Metabolic Panel-BMET) [80048-METABOL] 3)  TLB-CBC Platelet - w/Differential [85025-CBCD] 4)  TLB-Hepatic/Liver Function Pnl [80076-HEPATIC] 5)  TLB-Lipid Panel [80061-LIPID] 6)  TLB-TSH (Thyroid Stimulating Hormone) [84443-TSH] 7)  TLB-PSA (Prostate Specific Antigen) [84153-PSA] 8)  TLB-Udip ONLY [81003-UDIP] 9)  TLB-A1C / Hgb A1C (Glycohemoglobin) [83036-A1C] 10)  TLB-B12, Serum-Total ONLY [82607-B12] 11)  Gastroenterology Referral [GI] 12)  Doppler Referral [Doppler] 13)  Est. Patient 40-64 years [51761]

## 2010-09-07 NOTE — Letter (Signed)
Summary: Pre Visit Letter Revised  [facility]  [address] [place], [place] [address]   Phone: [phone]  Fax: [phone]        08/11/2010 MRN: [id number] [facility] [address] [place], [place]  [address]             Procedure Date:  09-26-09   Welcome to the Gastroenterology Division at [facility].    You are scheduled to see a nurse for your pre-procedure visit on 09-12-09 at 4:30p.m. on the 3rd floor at [facility], [address]  We ask that you try to arrive at our office 15 minutes prior to your appointment time to allow for check-in.  Please take a minute to review the attached form.  If you answer "Yes" to one or more of the questions on the first page, we ask that you call the person listed at your earliest opportunity.  If you answer "No" to all of the questions, please complete the rest of the form and bring it to your appointment.    Your nurse visit will consist of discussing your medical and surgical history, your immediate family medical history, and your medications.   If you are unable to list all of your medications on the form, please bring the medication bottles to your appointment and we will list them.  We will need to be aware of both prescribed and over the counter drugs.  We will need to know exact dosage information as well.    Please be prepared to read and sign documents such as consent forms, a financial agreement, and acknowledgement forms.  If necessary, and with your consent, a friend or relative is welcome to sit-in on the nurse visit with you.  Please bring your insurance card so that we may make a copy of it.  If your insurance requires a referral to see a specialist, please bring your referral form from your primary care physician.  No co-pay is required for this nurse visit.     If you cannot keep your appointment, please call [phone] to cancel or reschedule prior to your appointment date.  This  allows [place] the opportunity to schedule an appointment for another patient in need of care.    Thank you for choosing [facility] for your medical needs.  We appreciate the opportunity to care for you.  Please visit [place] at our website  to learn more about our practice.  Sincerely, The Gastroenterology Division

## 2010-09-07 NOTE — Miscellaneous (Signed)
Summary: Orders Update  Clinical Lists Changes  Orders: Added new Test order of Carotid Duplex (Carotid Duplex) - Signed 

## 2010-09-11 ENCOUNTER — Encounter (INDEPENDENT_AMBULATORY_CARE_PROVIDER_SITE_OTHER): Payer: Self-pay

## 2010-09-12 ENCOUNTER — Encounter: Payer: Self-pay | Admitting: Internal Medicine

## 2010-09-13 NOTE — Miscellaneous (Signed)
Summary: Skin Bx/[facility]  Skin Bx/[facility]   Imported By: Sherian Rein 09/04/2010 11:59:46  _____________________________________________________________________  External Attachment:    Type:   Image     Comment:   External Document

## 2010-09-21 NOTE — Letter (Signed)
Summary: Diabetic Instructions  [facility]  [address] [place], [place] [address]   Phone: [phone]  Fax: [phone]    Chase Sanchez Sep 29, 1960 MRN: [id number]   _  _   ORAL DIABETIC MEDICATION INSTRUCTIONS  The day before your procedure:   Take your diabetic pill as you do normally  The day of your procedure:   Do not take your diabetic pill    We will check your blood sugar levels during the admission process and again in Recovery before discharging you home  ________________________________________________________________________  _  _   INSULIN (LONG ACTING) MEDICATION INSTRUCTIONS (Lantus, NPH, 70/30, Humulin, Novolin-N)   The day before your procedure:   Take  your regular evening dose    The day of your procedure:   Do not take your morning dose    _  _   INSULIN (SHORT ACTING) MEDICATION INSTRUCTIONS (Regular, Humulog, Novolog)   The day before your procedure:   Do not take your evening dose   The day of your procedure:   Do not take your morning dose   _  _   INSULIN PUMP MEDICATION INSTRUCTIONS  We will contact the physician managing your diabetic care for written dosage instructions for the day before your procedure and the day of your procedure.  Once we have received the instructions, we will contact you.

## 2010-09-21 NOTE — Letter (Signed)
Summary: [facility] Instructions  [facility]  [address] [place], [place] [address]   Phone: [phone]  Fax: [phone]       TEION BALLIN    10-23-1960    MRN: [id number]        Procedure Day /Date:  Tuesday 09/26/2010     Arrival Time:  7:30 am      Procedure Time: 8:30 am     Location of Procedure:                    _ x_  [facility] (4th Floor)                        PREPARATION FOR COLONOSCOPY WITH MOVIPREP   Starting 5 days prior to your procedure Thursday 2/16 do not eat nuts, seeds, popcorn, corn, beans, peas,  salads, or any raw vegetables.  Do not take any fiber supplements (e.g. Metamucil, Citrucel, and Benefiber).  THE DAY BEFORE YOUR PROCEDURE         DATE: Monday 2/20 1.  Drink clear liquids the entire day-NO SOLID FOOD  2.  Do not drink anything colored red or purple.  Avoid juices with pulp.  No orange juice.  3.  Drink at least 64 oz. (8 glasses) of fluid/clear liquids during the day to prevent dehydration and help the prep work efficiently.  CLEAR LIQUIDS INCLUDE: Water Jello Ice Popsicles Tea (sugar ok, no milk/cream) Powdered fruit flavored drinks Coffee (sugar ok, no milk/cream) Gatorade Juice: apple, white grape, white cranberry  Lemonade Clear bullion, consomm, broth Carbonated beverages (any kind) Strained chicken noodle soup Hard Candy                             4.  In the morning, mix first dose of MoviPrep solution:    Empty 1 Pouch A and 1 Pouch B into the disposable container    Add lukewarm drinking water to the top line of the container. Mix to dissolve    Refrigerate (mixed solution should be used within 24 hrs)  5.  Begin drinking the prep at 5:00 p.m. The MoviPrep container is divided by 4 marks.   Every 15 minutes drink the solution down to the next mark (approximately 8 oz) until the full liter is complete.   6.  Follow completed prep with 16 oz of clear liquid of your choice (Nothing  red or purple).  Continue to drink clear liquids until bedtime.  7.  Before going to bed, mix second dose of MoviPrep solution:    Empty 1 Pouch A and 1 Pouch B into the disposable container    Add lukewarm drinking water to the top line of the container. Mix to dissolve    Refrigerate  THE DAY OF YOUR PROCEDURE      DATE: Tuesday 2/21  Beginning at 3:30 a.m. (5 hours before procedure):         1. Every 15 minutes, drink the solution down to the next mark (approx 8 oz) until the full liter is complete.  2. Follow completed prep with 16 oz. of clear liquid of your choice.    3. You may drink clear liquids until 6:30 am (2 HOURS BEFORE PROCEDURE).   MEDICATION INSTRUCTIONS  Unless otherwise instructed, you should take regular prescription medications with a small sip of water   as early as possible the morning of  your procedure.  Diabetic patients - see separate instructions.         OTHER INSTRUCTIONS  You will need a responsible adult at least 51 years of age to accompany you and drive you home.   This person must remain in the waiting room during your procedure.  Wear loose fitting clothing that is easily removed.  Leave jewelry and other valuables at home.  However, you may wish to bring a book to read or  an iPod/MP3 player to listen to music as you wait for your procedure to start.  Remove all body piercing jewelry and leave at home.  Total time from sign-in until discharge is approximately 2-3 hours.  You should go home directly after your procedure and rest.  You can resume normal activities the  day after your procedure.  The day of your procedure you should not:   Drive   Make legal decisions   Operate machinery   Drink alcohol   Return to work  You will receive specific instructions about eating, activities and medications before you leave.    The above instructions have been reviewed and explained to me by   Ulis Rias RN  September 12, 2010  4:59 PM     I fully understand and can verbalize these instructions _____________________________ Date _________

## 2010-09-21 NOTE — Miscellaneous (Signed)
Summary: Lec previsit  Clinical Lists Changes  Medications: Added new medication of MOVIPREP 100 GM  SOLR (PEG-KCL-NACL-NASULF-NA ASC-C) As per prep instructions. - Signed Rx of MOVIPREP 100 GM  SOLR (PEG-KCL-NACL-NASULF-NA ASC-C) As per prep instructions.;  #1 x 0;  Signed;  Entered by: Ulis Rias RN;  Authorized by: Hilarie Fredrickson MD;  Method used: Electronically to [facility]*, [address] [place], [place]  [address], Ph: [phone] or [phone], Fax: [phone] Observations: Added new observation of ALLERGY REV: Done (09/12/2010 16:30)    Prescriptions: MOVIPREP 100 GM  SOLR (PEG-KCL-NACL-NASULF-NA ASC-C) As per prep instructions.  #1 x 0   Entered by:   Ulis Rias RN   Authorized by:   Hilarie Fredrickson MD   Signed by:   Ulis Rias RN on 09/12/2010   Method used:   Electronically to        [facility]* (retail)       [address] [place], [place]  [address]       Ph: [phone] or [phone]       Fax: [phone]   RxID:   [id number]

## 2010-09-26 ENCOUNTER — Other Ambulatory Visit: Payer: Self-pay | Admitting: Internal Medicine

## 2010-10-02 ENCOUNTER — Encounter: Payer: Self-pay | Admitting: Internal Medicine

## 2010-10-12 NOTE — Procedures (Signed)
Summary: Colonoscopy/[facility]  Colonoscopy/[facility]   Imported By: Sherian Rein 10/06/2010 13:48:55  _____________________________________________________________________  External Attachment:    Type:   Image     Comment:   External Document

## 2010-10-24 ENCOUNTER — Telehealth: Payer: Self-pay | Admitting: Internal Medicine

## 2010-10-24 NOTE — Telephone Encounter (Signed)
Pt states that he has been having pain in back in kidney area and trouble w/urination x2 days. Pt states that he has quick, sharp pain intermittently in back that correlates w/pressure and urge to void. Pt states that he is having painful dysuria.  Pt feels that he needs a referral for a urologist. Informed Pt that he should subsequently go to Urgent Care for assessment until urology referral could be completed. Pt states he may wait as he has been having voiding issues for awhile and his Sxs now are intermittent. 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 Pt states he will be returning in 6 wks to Dr Jason Fila for more polyp removal and then again in 8 wks post. Please Advise.

## 2010-10-24 NOTE — Telephone Encounter (Signed)
Noted! Thank you

## 2010-12-04 ENCOUNTER — Other Ambulatory Visit: Payer: Self-pay | Admitting: Internal Medicine

## 2010-12-04 ENCOUNTER — Other Ambulatory Visit: Payer: Self-pay

## 2010-12-04 DIAGNOSIS — E119 Type 2 diabetes mellitus without complications: Secondary | ICD-10-CM

## 2010-12-11 ENCOUNTER — Other Ambulatory Visit: Payer: Self-pay | Admitting: *Deleted

## 2010-12-11 ENCOUNTER — Ambulatory Visit: Payer: BC Managed Care – PPO | Admitting: Internal Medicine

## 2010-12-11 MED ORDER — METFORMIN HCL 500 MG PO TABS: 500.0000 mg | ORAL_TABLET | Freq: Two times a day (BID) | ORAL | Status: DC

## 2010-12-13 ENCOUNTER — Other Ambulatory Visit (INDEPENDENT_AMBULATORY_CARE_PROVIDER_SITE_OTHER): Payer: BC Managed Care – PPO

## 2010-12-13 DIAGNOSIS — E119 Type 2 diabetes mellitus without complications: Secondary | ICD-10-CM

## 2010-12-13 LAB — BASIC METABOLIC PANEL
BUN: 13 mg/dL (ref 6–23)
Calcium: 8.7 mg/dL (ref 8.4–10.5)
Chloride: 102 mEq/L (ref 96–112)
Creatinine, Ser: 0.6 mg/dL (ref 0.4–1.5)

## 2010-12-13 LAB — HEMOGLOBIN A1C: Hgb A1c MFr Bld: 6.2 % (ref 4.6–6.5)

## 2010-12-19 NOTE — Assessment & Plan Note (Signed)
[facility]                           PRIMARY CARE OFFICE NOTE   NAME:Chase Sanchez, Chase Sanchez                       MRN:          [id number]  DATE:04/08/2007                            DOB:          Oct 15, 1960    The patient is a 50 year old male who presents for a wellness  examination.   CURRENT MEDICATIONS:  None, except for baby aspirin.  She stopped all  medications a while ago, with no apparent reason -- just because he does  not like to take medications.   PAST MEDICAL HISTORY:  1. Coronary disease.  2. Anxiety.  3. Gastroesophageal reflux disease.  4. Tobacco smoking.  5. Dyslipidemia.  6. Type 2 diabetes.  7. Hypergonadism.   SURGERIES:  1. Cardiac catheterization May 2001; with 25% mid-LAD.  2. Septoplasty in 2000.   ALLERGIES:  LIPITOR (gave him cramps).  CONTRAST (made him vomit).  CODEINE.   FAMILY HISTORY:  Positive for coronary disease.  Mother just died of  small cell lung cancer.   SOCIAL HISTORY:  Married.  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  Smokes  one pack a day.  Denies alcohol.  Mother just died last year.   REVIEW OF SYSTEMS:  His chest pain is not exertional, sharp and in the  middle.  Noticed some knots on the left side of his neck and around the  __________ .  Urinary troubles with incomplete emptying of the bladder.  No syncope.  Complains of night sweats, fatigue.  All the rest of the  inquired review of systems is as above or negative.   PHYSICAL EXAMINATION:  Blood pressure 129/83, pulse 69, temperature  97.2.  Weight 121 pounds.  The patient is in no acute distress.  HEENT:  Moist mucosa.  NECK:  Supple, no thyromegaly or bruits.  There is a 1 cm left  supraclavicular node present.  LUNGS:  Clear, no wheezes.  HEART:  Sounds 1 and 2.  No murmur and no gallop.  ABDOMEN:  Obese, soft and nontender.  No organomegaly.  EXTREMITIES:  Lower extremities without edema.  NEUROLOGIC:  He is alert and fully recognizing;  depressed.  RECTAL:  Reveals slightly enlarged prostate; no masses.  Stool guaiac  negative.  No rectal masses.  SKIN:  Clear.   LABORATORY STUDIES:  EKG normal sinus rhythm; 825/8 and without new  changes.  CBC:  Normal.  Glucose 228.  LFTs normal.  Cholesterol 184,  LDL 133.  PSA 0.56.   ASSESSMENT AND PLAN:  1. WELL EXAMINATION.  See below.  2. ANXIETY.  Lorazepam 1 mg p.o. daily p.r.n.  Risks and benefits were      discussed.  3. TYPE 2 DIABETES.  With noncompliance.  He was on diet only.  Asked      to restart Glucophage 500 mg p.o. twice daily.  Diabetic diet.      Weight loss, exercise.  4. RECURRENT ATYPICAL CHEST PAIN.  Obtain Cardiolite stress test.  5. CORONARY ARTERY DISEASE.  Treatment discussed.  Continue with      aspirin.  Restart Lovastatin 20 mg  daily.  Recheck labs in 3      months.  6. GASTROESOPHAGEAL REFLUX DISEASE.  Ranitidine  300 mg daily.  7. POSSIBLE BENIGN PROSTATIC HYPERTROPHY.  However, his symptoms could      be related to elevated glucose.  If no better with better sugar      control, give Hytrin 2 mg to take 1/2-1 q.h.s.  8. NIGHT SWEATS AND LOWERED LIBIDO.  Likely due to hypergonadism.      Will recheck his cholesterol.  9. SUPRACLAVICULAR LEFT-SIDED LYMPH NODE.  Will obtain CT scan of the      chest with contrast.  He may need to be premedicated for contrast      allergy.     Georgina Quint. Plotnikov, MD  Electronically Signed    AVP/MedQ  DD: 04/08/2007  DT: 04/08/2007  Job #: [id number]

## 2010-12-21 ENCOUNTER — Encounter: Payer: Self-pay | Admitting: Internal Medicine

## 2010-12-22 NOTE — Procedures (Signed)
[name], [name] NO.:  [id number]   MEDICAL RECORD NO.:  [id number]          PATIENT TYPE:  OUT   LOCATION:  SLEEP CENTER                 FACILITY:  [facility]   PHYSICIAN:  Marcelyn Bruins, M.D. [facility] DATE OF BIRTH:  04/18/1961   DATE OF STUDY:  05/16/2004                              NOCTURNAL POLYSOMNOGRAM   REFERRING PHYSICIAN:  Dr. Marcelyn Bruins.   INDICATION FOR THE STUDY:  Hypersomnia with sleep apnea.  Epworth score is  5.   SLEEP ARCHITECTURE:  The patient had a total sleep time of 318 minutes with  decreased REM and slow wave sleep was never achieved.  Sleep onset latency  was normal and REM latency was slightly prolonged.   IMPRESSION:  1.  Very mild obstructive sleep apnea/hypopnea syndrome with a respiratory      disturbance index of 9 events per hour and oxygen desaturation as low as      90%.  The patient also had a few central apneas which were not      clinically significant in number.  2.  Loud snoring noted throughout the study.  3.  No clinically significant cardiac arrhythmias.  4.  Moderate numbers of leg jerks with very mild sleep disruption.      KC/MEDQ  D:  06/05/2004 12:21:40  T:  06/05/2004 12:43:35  Job:  [id number]

## 2010-12-22 NOTE — H&P (Signed)
[facility]  Patient:    Chase Sanchez, Chase Sanchez                         MRN: [id number] Attending:  Javier Docker, M.D. Dictator:   Alexzandrew L. Perkins, P.A.-C.                         History and Physical  DATE OF BIRTH:  06/12/1961.  DATE OF OFFICE VISIT HISTORY AND PHYSICAL:  September 09, 2000.  CHIEF COMPLAINT:  Right shoulder pain.  HISTORY OF PRESENT ILLNESS:  Patient is a 50 year old who has been seen for right shoulder pain for some time now.  He has been seen and evaluated by Dr. Javier Docker.  He was seen in the office for continued pain and a popping sensation with difficulty to abduct his arm.  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  He had an injury to his right shoulder back in December of 2001 when he was cleaning a motor; since that time, he has had continued pain.  He was sent for an MRI which did indicate a supraspinatus tendinosis with a full-thickness rotator cuff tear. Due to the fact he has not improved and has had continued pain, it was felt he would benefit from undergoing surgical intervention.  Risks and benefits of the procedure have been discussed with the patient and he accepted these going into surgery.  ALLERGIES:  CODEINE causes hives, swelling, itching and nausea.  CURRENT MEDICATIONS 1. He recently discontinued a Z-Pak for sinusitis. 2. Guaitex PSE 600/120 mg b.i.d. 3. Vioxx 25 mg daily. 4. Pravachol 40 mg daily. 5. Protonix 40 mg daily. 6. Darvocet-N p.r.n. pain.  PAST MEDICAL HISTORY:  Acid reflux, elevated cholesterol, mild coronary artery disease.  PAST SURGICAL HISTORY:  Cardiac catheterization.  SOCIAL HISTORY:  He is married and has one child.  He is a one-pack-per-day smoker for approximately 15 years.  He also has an alcohol intake of approximately a six-pack daily; he states he is trying to cut back on his alcohol intake.  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  FAMILY HISTORY:  Mother living, age 17, with diabetes, coronary artery disease, a history of blood clot and a myocardial infarction.  Father living, age 6, with history of diabetes and MI.  REVIEW OF SYSTEMS:  GENERAL:  No fevers, chills or night sweats.  NEUROLOGIC: No seizure, syncope or paralysis.  RESPIRATORY:  Patient has had a recent cold and sinus infection with still remnants of some congestion; however, no other complaints.  No hemoptysis.  No shortness of breath.  CARDIOVASCULAR:  No chest pain, angina or orthopnea.  GI:  No nausea, vomiting, diarrhea or constipation.  GU:  No dysuria, hematuria or discharge.  MUSCULOSKELETAL: Pertinent to that of the right shoulder found in the history of present illness.  PHYSICAL EXAMINATION  VITAL SIGNS:  Pulse 82, respirations 16, blood pressure 144/84.  GENERAL:  Patient is a 50 year old white male, well-nourished, well-developed, who appears to be in no acute distress.  He is alert, oriented and cooperative.  He is accompanied by his wife.  HEENT:  Normocephalic, atraumatic.  Pupils are round and reactive.  Oropharynx is clear.  EOMs are intact.  NECK:  Supple.  No carotid bruits.  CHEST:  Clear to auscultation and percussion.  No rhonchi or rales.  No wheezing.  HEART:  Regular rate and rhythm.  S1 and S2 are noted.  No rubs, thrills, palpitations.  ABDOMEN:  Soft, nontender.  Bowel sounds are present.  No rebound or guarding.  RECTAL:  Not done, not pertinent to present illness.  BREASTS:  Not done, not pertinent to present illness.  GENITALIA:  Not done, not pertinent to present illness.  EXTREMITIES:  Significant to that to the right upper extremity.  Patient has positive impingement signs on passive range of motion of the shoulder.  There is decreased internal rotation.  He also had weakness on external rotation. Motor function is 5/5 throughout the right upper  extremity.  IMPRESSION 1. Right rotator cuff tear. 2. Gastroesophageal reflux disease. 3. Hypercholesterolemia. 4. Mild coronary artery disease.  PLAN:  Patient will be admitted to [facility] to undergo an open rotator cuff repair.  Surgery will be performed by Dr. Javier Docker. DD:  09/10/00 TD:  09/11/00 Job: [id number]

## 2010-12-22 NOTE — Cardiovascular Report (Signed)
[place]  Patient:    Chase Sanchez, Chase Sanchez                       MRN: [id number] Proc. Date: 12/14/99 Adm. Date:  [id number] Disc. Date: [id number] Attending:  Daisey Must CC:         Sonda Primes, M.D. [facility]             Daisey Must, M.D. [facility]             Cardiac Catheterization Laboratory                        Cardiac Catheterization  PROCEDURES PERFORMED:  Left heart catheterization with coronary angiography and left ventriculography.  INDICATIONS:  Mr. Hoel is a 50 year old male with cardiac risk factors of tobacco abuse, hyperlipidemia, and family history of premature coronary artery disease.  He presented to the emergency room earlier today with a prolonged episode of chest pain.  Because of his risk factors and concern for possible unstable angina, he was brought to the cardiac catheterization laboratory.  DESCRIPTION OF PROCEDURE:  A 6 French sheath was placed in the right femoral artery.  Standard Judkins 6 French catheters were utilized.  Contrast was Omnipaque.  There were no complications.  RESULTS:  HEMODYNAMICS:  Left ventricular pressure 128/18.  Aortic pressure 132/86 No aortic valve gradient.  LEFT VENTRICULOGRAM:  Wall motion is normal.  Ejection fraction greater than 65%.  CORONARY ARTERIOGRAPHY:  (Left dominant).  Left main:  Left main is normal.  Left anterior descending:  The LAD has a 25% stenosis in the mid vessel.  It gives rise to a large first diagonal and a small second diagonal.  Left circumflex:  The left circumflex is a dominant vessel.  It gives rise to a small OM-1 and a large OM-2.  OM-2 has a 25% stenosis at its origin.  The circumflex also gives rise to a normal sized posterolateral branch and a normal sized posterior descending artery.  In the distal left circumflex at the origin of the posterior descending artery is a 25% stenosis.  Right coronary artery:  The right coronary artery is a small  nondominant vessel.  It is free of angiographic disease.  IMPRESSION: 1. Normal left ventricular systolic function. 2. Mild insignificant coronary artery disease. DD:  12/14/99 TD:  12/16/99 Job: [id number]

## 2010-12-22 NOTE — Op Note (Signed)
NAME:  Chase Sanchez, Chase Sanchez                          ACCOUNT NO.:  [id number]   MEDICAL RECORD NO.:  [id number]                   PATIENT TYPE:  AMB   LOCATION:  DAY                                  FACILITY:  [facility]   PHYSICIAN:  Jene Every, M.D.                 DATE OF BIRTH:  1961/04/27   DATE OF PROCEDURE:  11/25/2002  DATE OF DISCHARGE:                                 OPERATIVE REPORT   PREOPERATIVE DIAGNOSIS:  Rotator cuff tear of the left shoulder.   POSTOPERATIVE DIAGNOSIS:  Massive rotator cuff tear of the left shoulder.   PROCEDURES:  Open rotator cuff repair and acromioplasty utilizing Arthrex  suture anchors.   BRIEF HISTORY AND INDICATION:  This is a 50 year old who after an injury to  the shoulder sustained a fairly significant tear of the rotator cuff,  confirmed with MRI.  Operative intervention is indicated for repair of the  tendon and acromioplasty.  The risks and benefits were discussed, including  bleeding, infection, damage to vascular structures, recurrent tear,  suboptimal range of motion, etc.   DESCRIPTION OF PROCEDURE:  Patient supine in the beach chair position after  induction of adequate general anesthesia, 1 g Kefzol.  The left shoulder and  arm were prepped and draped in the usual sterile fashion.  The acromion was  delineated with a surgical marker.  An incision was made in the anterior  aspect of the acromion in Langer's lines.  The subcutaneous tissue was  dissected and electrocautery was utilized to achieve hemostasis.  The raphe  between the anterior and lateral heads of the deltoid was identified and  subperiosteally elevated medially and lateral to the [facility] joint, laterally to  the anterior lateral aspect of the acromion.  There was no instability to  the anterior aspect of the [facility].  They thought they had seen an os acromiale  in the MRI, but it is not evident here.   We just had elevated the deltoid attachment subperiosteally just from the  anterior aspect of the acromion.  There was a large spur hanging down  anterolaterally and after skeletonizing the anterior aspect of the acromion,  protecting the deltoid and soft tissues, we utilized a saw to remove 3 mm of  the anterior aspect of the acromion and then contoured it with a high-speed  bur.  There was no longer any bony impingement following that.  The raphe  was identified.  We had taken down the CA ligament and excised it.  A  hypertrophic bursa was noted, and this was excised.  A large rotator cuff  tear, retracted, was noted.  There was a complete detachment of the  supraspinatus.  We debrided the edges of the rotator cuff, formed a trough  in the greater tuberosity with a Leksell rongeur to good bleeding tissue.  I  then placed drill holes into the trough and in  the lateral aspect of the  humerus, #2 Ethibond suture into the rotator cuff tendon in a horizontal  mattress type, advancing it into the trough and tying it over the lateral  aspect of the humerus.  I then used two Arthrex parachute anchors to further  secure the rotator cuff to the greater tuberosity with the appropriate  tapping, after appropriate utilizing of awl and the tap, and insertion of  the parachute tendon appropriately.  I then reinforced the repair with #1  Vicryl interrupted figure-of-eight sutures through the greater tuberosity.  Following this there was an excellent repair, full coverage noted, and good  range of motion without significant tension on the site.  Prior to this I  had mobilized the rotator cuff tendon utilizing digital palpation and a Cobb  elevator.  I reached under the [facility] joint, and there was no significant  impingement occurring from the [facility] joint.   Next the wound was copiously irrigated, as it was prior to closure of the  rotator cuff tendon.  I repaired the raphe with #1 Vicryl interrupted figure-  of-eight sutures and closed the flap on the deltoid over the anterior aspect   of the acromion with #1 Vicryl interrupted figure-of-eight sutures as well  with excellent closure.  No tension on the wound was noted.  I closed the  subcutaneous tissue with 2-0 Vicryl simple sutures.  The skin was  reapproximated with  Prolene, the wound reinforced with Steri-Strips, sterile dressings applied.  He was placed in an abduction pillow, extubated without difficulty, and  transported in to the recovery room in satisfactory condition.   The patient tolerated the procedure well with no complications.  Minimal  blood loss.                                                Jene Every, M.D.    Cordelia Pen  D:  11/25/2002  T:  11/25/2002  Job:  [id number]

## 2010-12-22 NOTE — Discharge Summary (Signed)
[place]  Patient:    Chase Sanchez, Chase Sanchez                         MRN: [id number] Adm. Date:  12/14/99 Disc. Date: 12/14/99 Attending:  Daisey Must, M.D. [facility] Dictator:   Gene Serpe, P.A. CC:         Sonda Primes, M.D., Internal Medicine                           Discharge Summary  PROCEDURES: 1. Cardiac catheterization. 2. Ventilation/profusion scan.  REASON FOR ADMISSION:  Patient is a 50 year old male, with no previous history f coronary artery disease, and cardiac risk factors notable for tobacco smoking and family history of coronary artery disease, who presented with acute sharp chest  discomfort, constant since onset, and worsens with deep inspiration and leaning  forward.  Admission EKG notable for RBB (no prior EKGs for comparison), but otherwise no acute changes.  Patient did report a negative routine treadmill approximately one year prior.  He stated that since that time, he had been having similar, albeit milder and less frequent symptoms culminating in this his most severe episode.  LABORATORY DATA:  WBC 11.7, hemoglobin 15.3, hematocrit 45.3, platelet count 242,000.  Sodium 138, potassium 3.8, glucose 117, BUN 16, creatinine 0.7. LFTs: Mildly elevated SGPT 49, otherwise normal.  CPK 105/1.1, troponin I less than 0.03. TSH 2.67.  Alcohol less than 10.  Admission chest x-ray:  NED.  HOSPITAL COURSE:  Patient ruled out for myocardial infarction with negative cardiac enzymes.  Recommendation was to proceed with direct coronary angiogram to rule out ischemic etiology.  Cardiac catheterization, performed by Dr. Judie Petit. Pulsipher on day of admission (see cath report for details) revealed insignificant coronary artery disease/normal LVF.  Specifically, there were 25% mid-LAD, distal CFX, and osteal OM2 lesions with normal RCA.  Dr. Gerri Spore then recommended proceeding with ventilation/profusion scan, which was within normal  limits.  FINAL RECOMMENDATIONS:  Discharge on H2 antagonist.  DISCHARGE MEDICATIONS: 1. Pepcid 20 mg b.i.d. 2. Baby aspirin 81 mg q. d.  INSTRUCTIONS:  Patient is to refrain from heavy lifting/driving for two days, maintain low fat/cholesterol diet, and call our office if there is any swelling/bleeding of the groin.  Patient is also encouraged to discontinue smoking tobacco.  Patient is instructed to schedule a followup appointment with Dr. Judie Petit. Pulsipher n two weeks.  He is subsequently to follow up with his primary care physician, Dr. Mervyn Skeeters Plotnikov, for continued management of possible GERD symptoms.  DISCHARGE DIAGNOSES: 1. Noncardiac chest pain.    a. Cardiac catheterization, 5/10, insignificant coronary artery disease/normal       left ventricle.    b. Normal ventilation/profusion scan. 2. RBB. 3. Tobacco. 4. Alcohol abuse. 5. Family history of coronary artery disease. DD:  12/14/99 TD:  12/14/99 Job: [id number]

## 2010-12-22 NOTE — Op Note (Signed)
[facility]  Patient:    Chase Sanchez, Chase Sanchez                       MRN: [id number] Proc. Date: 09/18/00 Adm. Date:  [id number] Attending:  Pierce Crane                           Operative Report  PREOPERATIVE DIAGNOSES:  Rotator cuff tear, AC arthrosis, right shoulder.  POSTOPERATIVE DIAGNOSES:  Rotator cuff tear, AC arthrosis, right shoulder.  PROCEDURE:  Right open rotator cuff repair, acromioplasty, clavicle resection.  BRIEF HISTORY:  A 50 year old with shoulder pain, MRI indicating AC arthrosis, rotator cuff tear preoperatively demonstrated tenderness of the AC joint and positive crossover maneuver, weakness in external rotation, positive impingement sign. Operative intervention was indicated for distal clavicle resection rotator cuff repair. The risks and benefits were discussed including bleeding, infection, damage to neurovascular structures, muscle fibrosis, recurrent tear, need for revision, etc.  TECHNIQUE:  The patient in supine beach chair position after a satisfactory level of general anesthesia and 1 gm of Kefzol. The right shoulder, precordial region, upper extremity was prepped and draped in the usual sterile fashion. The [facility] joint was demarcated with a surgical marker. An incision was made along its line over the anterior aspect of the acromion midway. A 0.25% Marcaine with epinephrine infiltrated the subcutaneous tissue. The raphe between the anterior lateral heads of the deltoid were identified and divided. This was subperiosteally elevated over the top of the [facility] joint skeletonizing the distal clavicle. The capsule and trapezial and deltoid fascia were spared. The [facility] joint was identified, copious fluid, degenerative changes were noted and a spur posterior inferior was noted. A Bennett rongeur was utilized to elevate the clavicle and a spur off the distal clavicle was removed as was the anterior aspect of the acromion. A few  millimeters of distal clavicle were removed. A greater removal was not necessary due to the availability of space following removal of a spur off the acromion and off the clavicle. Next, the CA ligament was detached from the anterior aspect of the acromion and there was a large tear off the anterior and inferior aspect of the acromion. The CA ligament was excised. The anterior spur was removed with the rongeur. A high speed bur was utilized to perform an acromioplasty anteriorly removing approximately 3 mm of the inferior surface and 2 mm anterior to posterior flush with the anterior aspect of the clavicle. There was actually some down slope in the acromion and a small ridge laterally and this had to be removed as well with the rongeur and contoured with a smooth base. There was no residual impingement noted following the acromioplasty. Next the wound was copiously irrigated, bursa was resected. A large retracted tear of the supraspinatus was noted. A trough was made in the greater tuberosity with a rongeur debriding bleeding cancellous bone. The leading edge of the rotator cuff was incised to good bleeding tissue. The cuff was digitally mobilized and advanced, secured with three Mitek suture anchors. The arm was held in abduction, advanced in neutral position. Excellent purchase was obtained. There was on tension on the wound side and good external internal rotation, adduction to neutral and adduction to 90 without impingement or pulling out of the sutures. The wound was then copiously irrigated. Next the capsule over the [facility] joint was repaired with #1 Vicryl in figure-of-eight sutures. The deltoid and trapezial  fascia had been preserved due to the approach. The deltoid was then reapproximated with #1 Vicryl in interrupted figure-of-eight sutures to the acromion with a good deltoid cuff. No tension on the wound. The raphe was repaired as well. There is no bleeding noted. A 0.25% Marcaine  with epinephrine was infiltrated in the subacromial joint. The wound was copiously irrigated. The subcutaneous tissue reapproximated with 2-0 Vicryl simple suture, the skin was reapproximated with 3-0 subcuticular Prolene. The wound was dresses sterilely. The patient was placed on an abduction pillow, awakened without difficulty and transported to the recovery room in satisfactory condition.  The patient tolerated the procedure well. No complications. DD:  09/18/00 TD:  09/19/00 Job: [id number]

## 2010-12-25 ENCOUNTER — Ambulatory Visit (INDEPENDENT_AMBULATORY_CARE_PROVIDER_SITE_OTHER): Payer: BC Managed Care – PPO | Admitting: Internal Medicine

## 2010-12-25 ENCOUNTER — Encounter: Payer: Self-pay | Admitting: Internal Medicine

## 2010-12-25 DIAGNOSIS — K635 Polyp of colon: Secondary | ICD-10-CM

## 2010-12-25 DIAGNOSIS — D126 Benign neoplasm of colon, unspecified: Secondary | ICD-10-CM

## 2010-12-25 DIAGNOSIS — E119 Type 2 diabetes mellitus without complications: Secondary | ICD-10-CM

## 2010-12-25 DIAGNOSIS — I2510 Atherosclerotic heart disease of native coronary artery without angina pectoris: Secondary | ICD-10-CM

## 2010-12-25 DIAGNOSIS — E785 Hyperlipidemia, unspecified: Secondary | ICD-10-CM

## 2010-12-25 DIAGNOSIS — E291 Testicular hypofunction: Secondary | ICD-10-CM

## 2010-12-25 NOTE — Progress Notes (Signed)
  Subjective:    Patient ID: Chase Sanchez, male    DOB: 1961/05/26, 50 y.o.   MRN: [id number]  HPI  The patient presents for a follow-up of  chronic hypertension, chronic dyslipidemia, type 2 diabetes controlled with medicines, polyps  He stopped Herbalife than restarted  Review of Systems  Constitutional: Negative for chills and appetite change. Unexpected weight change: gained wt.  HENT: Negative for nosebleeds and dental problem.   Eyes: Negative for redness.  Respiratory: Negative for cough and shortness of breath.   Cardiovascular: Negative for chest pain and leg swelling.  Gastrointestinal: Negative for blood in stool.  Musculoskeletal: Negative for joint swelling.  Skin: Negative for rash.  Neurological: Negative for numbness.  Psychiatric/Behavioral: Negative for dysphoric mood.       Objective:   Physical Exam  Constitutional: He is oriented to person, place, and time. He appears well-developed.       Obese  HENT:  Mouth/Throat: Oropharynx is clear and moist.  Eyes: Conjunctivae are normal. Pupils are equal, round, and reactive to light.  Neck: Normal range of motion. No JVD present. No thyromegaly present.  Cardiovascular: Normal rate, regular rhythm, normal heart sounds and intact distal pulses.  Exam reveals no gallop and no friction rub.   No murmur heard. Pulmonary/Chest: Effort normal and breath sounds normal. No respiratory distress. He has no wheezes. He has no rales. He exhibits no tenderness.  Abdominal: Soft. Bowel sounds are normal. He exhibits no distension and no mass. There is no tenderness. There is no rebound and no guarding.  Musculoskeletal: Normal range of motion. He exhibits no edema and no tenderness.  Lymphadenopathy:    He has no cervical adenopathy.  Neurological: He is alert and oriented to person, place, and time. He has normal reflexes. No cranial nerve deficit. He exhibits normal muscle tone. Coordination normal.  Skin: Skin is warm and  dry. No rash noted.  Psychiatric: He has a normal mood and affect. His behavior is normal. Judgment and thought content normal.        Lab Results  Component Value Date   WBC 12.3* 08/09/2010   HGB 15.3 08/09/2010   HCT 44.1 08/09/2010   PLT 235.0 08/09/2010   CHOL 188 08/09/2010   TRIG 131.0 08/09/2010   HDL 27.50* 08/09/2010   ALT 24 08/09/2010   AST 18 08/09/2010   NA 137 12/13/2010   K 4.5 12/13/2010   CL 102 12/13/2010   CREATININE 0.6 12/13/2010   BUN 13 12/13/2010   CO2 28 12/13/2010   TSH 1.74 08/09/2010   PSA 0.80 08/09/2010   HGBA1C 6.2 12/13/2010     Assessment & Plan:   DIABETES MELLITUS, TYPE II Restart w/wt loss. Labs reviewed  HYPOGONADISM He declined Rx. F/u w/Dr Brunilda Payor on that  CORONARY ARTERY DISEASE No CP or angina  HYPERLIPIDEMIA On Rx  Colon polyposis He tells me he just had a cancerous polyp 1/18 and was rec. Surgery. Surg appt is pending

## 2010-12-25 NOTE — Assessment & Plan Note (Signed)
On Rx 

## 2010-12-25 NOTE — Assessment & Plan Note (Signed)
He tells me he just had a cancerous polyp 1/18 and was rec. Surgery. Surg appt is pending

## 2010-12-25 NOTE — Assessment & Plan Note (Signed)
No CP or angina

## 2010-12-25 NOTE — Assessment & Plan Note (Addendum)
He declined Rx. F/u w/Dr Brunilda Payor on that

## 2010-12-25 NOTE — Assessment & Plan Note (Signed)
Restart w/wt loss. Labs reviewed

## 2011-03-28 ENCOUNTER — Telehealth: Payer: Self-pay | Admitting: *Deleted

## 2011-03-28 NOTE — Telephone Encounter (Signed)
Rf req for Lorazepam 1 mg 2 po prn as directed by MD. # 60. Last filled 3.26.12. Ok to Rf?

## 2011-03-29 MED ORDER — LORAZEPAM 1 MG PO TABS: 2.0000 mg | ORAL_TABLET | ORAL | Status: DC | PRN

## 2011-03-29 NOTE — Telephone Encounter (Signed)
OK to fill this prescription with additional refills x3 Thank you!  

## 2011-04-16 ENCOUNTER — Telehealth: Payer: Self-pay | Admitting: Cardiology

## 2011-04-16 NOTE — Telephone Encounter (Signed)
EKG from 04/04/11 reviewed by dr Jens Som, app made for tomorrow pt to go to er with any worsening SX, CP off and on x 19mo ,Pt verbalized understanding. C/O  left chest area cp, c/o diaphoresis to be extreme last night, occ nausea, no current cp and no vitals taken since [place] saw 04/04/11 and were stable at that note/visit. To be seen by Dr Eden Emms tomorrow, pt accepted app.Alfonso Ramus RN

## 2011-04-16 NOTE — Telephone Encounter (Addendum)
Sob, sweating, chest pains off and on all weekend, to have ekg faxed over this am, was abnormal per pt, fax is now here in scheduling

## 2011-04-17 ENCOUNTER — Ambulatory Visit (INDEPENDENT_AMBULATORY_CARE_PROVIDER_SITE_OTHER): Payer: BC Managed Care – PPO | Admitting: Cardiovascular Disease

## 2011-04-17 ENCOUNTER — Encounter: Payer: Self-pay | Admitting: Cardiovascular Disease

## 2011-04-17 DIAGNOSIS — R0600 Dyspnea, unspecified: Secondary | ICD-10-CM | POA: Insufficient documentation

## 2011-04-17 DIAGNOSIS — R0602 Shortness of breath: Secondary | ICD-10-CM

## 2011-04-17 DIAGNOSIS — F17200 Nicotine dependence, unspecified, uncomplicated: Secondary | ICD-10-CM

## 2011-04-17 DIAGNOSIS — R0609 Other forms of dyspnea: Secondary | ICD-10-CM

## 2011-04-17 DIAGNOSIS — E785 Hyperlipidemia, unspecified: Secondary | ICD-10-CM

## 2011-04-17 DIAGNOSIS — E119 Type 2 diabetes mellitus without complications: Secondary | ICD-10-CM

## 2011-04-17 DIAGNOSIS — R079 Chest pain, unspecified: Secondary | ICD-10-CM | POA: Insufficient documentation

## 2011-04-17 NOTE — Assessment & Plan Note (Signed)
Target A1c 6.5 or less  [place] type diet

## 2011-04-17 NOTE — Assessment & Plan Note (Signed)
Counseled for less than 10 minutes Will get patches and F/U with [facility]

## 2011-04-17 NOTE — Assessment & Plan Note (Signed)
Atypical with no ECG changes.  Multiple CRF;s  Stress myovue

## 2011-04-17 NOTE — Progress Notes (Signed)
50 yo patient of Dr Paulette Blanch.  Seen at [facility] 8/29 for progressive dyspnea and SSCP.  Previously seen by Dr Gerri Spore.  Cath 2001 with left dominant cors and no significant disease.  Normal EF  CRF;s inculde DM, smoking , HTN and elevated lipids.  CXR NAD and ECG no actue changes NSR 63 with ICRBBB.  Has had dyspnea especially in humidity.  Counseled for less than 10 minutes regarding smoking cessation.  Will try electronic cig. And patches.  Did not like Chantix.  Smokes a ppd since age 20. This is the cause of his dyspnea.  DM fairly well controlled with BS around 110 in am.  Discussed low carb diet for central obesity.  SSCP atypical fleeting and sharp.  Not always exertional.  Associated with dyspnea.  Persistant last 3 months.  Compliant with meds.  New primary is Clinical cytogeneticist at [place]  ROS: Denies fever, malais, weight loss, blurry vision, decreased visual acuity, cough, sputum, SOB, hemoptysis, pleuritic pain, palpitaitons, heartburn, abdominal pain, melena, lower extremity edema, claudication, or rash.  All other systems reviewed and negative   General: Affect appropriate Healthy:  appears stated age HEENT: normal Neck supple with no adenopathy JVP normal no bruits no thyromegaly Lungs clear with no wheezing and good diaphragmatic motion Heart:  S1/S2 no murmur,rub, gallop or click PMI normal Abdomen: benighn, BS positve, no tenderness, no AAA no bruit.  No HSM or HJR Distal pulses intact with no bruits No edema Neuro non-focal Skin warm and dry No muscular weakness  Medications Current Outpatient Prescriptions  Medication Sig Dispense Refill  . aspirin 81 MG tablet Take 81 mg by mouth daily.        Tery Sanfilippo Sodium (COLACE PO) Take by mouth as needed.        . fluticasone (FLONASE) 50 MCG/ACT nasal spray 1 spray by Nasal route as needed.        Marland Kitchen guaiFENesin (MUCINEX) 600 MG 12 hr tablet Take by mouth as directed.        Marland Kitchen LORazepam (ATIVAN) 1 MG tablet Take 2 tablets (2 mg  total) by mouth as needed.  60 tablet  3  . lovastatin (MEVACOR) 40 MG tablet Take 40 mg by mouth daily.        . metFORMIN (GLUCOPHAGE) 500 MG tablet Take 1 tablet (500 mg total) by mouth 2 (two) times daily with a meal.  180 tablet  3  . polyethylene glycol (MIRALAX / GLYCOLAX) packet Take 17 g by mouth at bedtime.        . ranitidine (ZANTAC) 300 MG tablet Take 300 mg by mouth as needed.        . Silodosin (RAPAFLO) 8 MG CAPS Take 1 capsule by mouth daily.          Allergies Codeine and Iohexol  Family History: Family History  Problem Relation Age of Onset  . Diabetes Other   . Cancer Mother     lung  . Cancer Father     prostate    Social History: History   Social History  . Marital Status: Married    Spouse Name: N/A    Number of Children: N/A  . Years of Education: N/A   Occupational History  . Not on file.   Social History Main Topics  . Smoking status: Current Everyday Smoker  . Smokeless tobacco: Not on file  . Alcohol Use: No  . Drug Use: No  . Sexually Active: Yes   Other Topics Concern  .  Not on file   Social History Narrative  . No narrative on file    Electrocardiogram:  NSR 67 ICRBBB LAE no change from 8/29 at [facility]  Or from 2001 precath  Assessment and Plan

## 2011-04-17 NOTE — Patient Instructions (Signed)
Your physician has requested that you have en exercise stress myoview. For further information please visit [email or web address] Please follow instruction sheet, as given.   Your physician has recommended that you have a pulmonary function test. Pulmonary Function Tests are a group of tests that measure how well air moves in and out of your lungs.

## 2011-04-17 NOTE — Assessment & Plan Note (Signed)
Cholesterol is at goal.  Continue current dose of statin and diet Rx.  No myalgias or side effects.  F/U  LFT's in 6 months. Lab Results  Component Value Date   LDLCALC 134* 08/09/2010   Continue statin

## 2011-04-17 NOTE — Assessment & Plan Note (Signed)
COPD.  CXR ok.  PFT;s pre and post bronchodilator  D/C cirgarettes

## 2011-04-19 ENCOUNTER — Ambulatory Visit (HOSPITAL_COMMUNITY): Payer: BC Managed Care – PPO | Attending: Cardiovascular Disease | Admitting: Radiology

## 2011-04-19 DIAGNOSIS — R0609 Other forms of dyspnea: Secondary | ICD-10-CM

## 2011-04-19 DIAGNOSIS — R0602 Shortness of breath: Secondary | ICD-10-CM | POA: Insufficient documentation

## 2011-04-19 DIAGNOSIS — R079 Chest pain, unspecified: Secondary | ICD-10-CM | POA: Insufficient documentation

## 2011-04-19 DIAGNOSIS — Z9289 Personal history of other medical treatment: Secondary | ICD-10-CM

## 2011-04-19 HISTORY — DX: Personal history of other medical treatment: Z92.89

## 2011-04-19 MED ORDER — TECHNETIUM TC 99M TETROFOSMIN IV KIT
11.0000 | PACK | Freq: Once | INTRAVENOUS | Status: AC | PRN
  Administered 2011-04-19: 11 via INTRAVENOUS

## 2011-04-19 MED ORDER — TECHNETIUM TC 99M TETROFOSMIN IV KIT
33.0000 | PACK | Freq: Once | INTRAVENOUS | Status: AC | PRN
  Administered 2011-04-19: 33 via INTRAVENOUS

## 2011-04-19 NOTE — Progress Notes (Signed)
[facility] SITE 3 NUCLEAR MED [address] [place] [address] [phone]  Cardiology Nuclear Med Study  Chase Sanchez is a 50 y.o. male [id number] 1961/01/30   Nuclear Med Background Indication for Stress Test:  Evaluation for Ischemia History:  COPD, '01Heart Catheterization: LAD 25% EF >65% and Myocardial Perfusion Study Cardiac Risk Factors: Family History - CAD, Hypertension, Lipids, NIDDM and Smoker  Symptoms:  Chest Pain, DOE, Fatigue, Palpitations and SOB   Nuclear Pre-Procedure Caffeine/Decaff Intake:  None NPO After: 7:00pm   Lungs:  clear IV 0.9% NS with Angio Cath:  20g  IV Site: R Antecubital  IV Started by:  Stanton Kidney, EMT-P  Chest Size (in):  44 Cup Size: n/a  Height: 5\' 9"  (1.753 m)  Weight:  214 lb (97.07 kg)  BMI:  Body mass index is 31.60 kg/(m^2). Tech Comments:  NA    Nuclear Med Study 1 or 2 day study: 1 day  Stress Test Type:  Stress  Reading MD: Cassell Clement, MD  Order Authorizing Provider:  P.Nishan  Resting Radionuclide: Technetium 26m Tetrofosmin  Resting Radionuclide Dose: 11.0 mCi   Stress Radionuclide:  Technetium 23m Tetrofosmin  Stress Radionuclide Dose: 33.0 mCi           Stress Protocol Rest HR: 59 Stress HR: 144  Rest BP: 123/75 Stress BP: 201/96  Exercise Time (min): n/a METS: n/a   Predicted Max HR: 169 bpm % Max HR: 85.21 bpm Rate Pressure Product: 78469   Dose of Adenosine (mg):  n/a Dose of Lexiscan: n/a mg  Dose of Atropine (mg): n/a Dose of Dobutamine: n/a mcg/kg/min (at max HR)  Stress Test Technologist: Milana Na, EMT-P  Nuclear Technologist:  Domenic Polite, CNMT     Rest Procedure:  Myocardial perfusion imaging was performed at rest 45 minutes following the intravenous administration of Technetium 56m Tetrofosmin. Rest ECG: Sinus Bradycardia  Stress Procedure:  The patient exercised for 7:30.  The patient stopped due to chest pressure, sob, and denied any chest pain.  There were  no significant ST-T wave changes and a rare pac.  Technetium 33m Tetrofosmin was injected at peak exercise and myocardial perfusion imaging was performed after a brief delay. Stress ECG: No significant ST segment change suggestive of ischemia.  QPS Raw Data Images:  Normal; no motion artifact; normal heart/lung ratio. Stress Images:  Normal homogeneous uptake in all areas of the myocardium. Rest Images:  Normal homogeneous uptake in all areas of the myocardium. Subtraction (SDS):  No evidence of ischemia. Transient Ischemic Dilatation (Normal <1.22):  1.17 Lung/Heart Ratio (Normal <0.45):  0.26  Quantitative Gated Spect Images QGS EDV:  119 ml QGS ESV:  39 ml QGS cine images:  NL LV Function; NL Wall Motion QGS EF: 67%  Impression Exercise Capacity:  Good exercise capacity. BP Response:  Hypertensive blood pressure response. Clinical Symptoms:  Mild chest pain/dyspnea. ECG Impression:  No significant ST segment change suggestive of ischemia. Comparison with Prior Nuclear Study: No images to compare  Overall Impression:  Normal stress nuclear study.    Cassell Clement

## 2011-04-23 ENCOUNTER — Ambulatory Visit: Payer: BC Managed Care – PPO | Admitting: Internal Medicine

## 2011-04-23 ENCOUNTER — Telehealth: Payer: Self-pay | Admitting: Cardiovascular Disease

## 2011-04-23 NOTE — Telephone Encounter (Signed)
Spoke with pt, aware stress test was normal Chase Sanchez

## 2011-04-23 NOTE — Telephone Encounter (Signed)
Pt wants to know test results. He has been out of town please call

## 2011-04-24 ENCOUNTER — Encounter: Payer: Self-pay | Admitting: *Deleted

## 2011-04-30 ENCOUNTER — Ambulatory Visit (INDEPENDENT_AMBULATORY_CARE_PROVIDER_SITE_OTHER): Payer: BC Managed Care – PPO | Admitting: Internal Medicine

## 2011-04-30 ENCOUNTER — Telehealth: Payer: Self-pay | Admitting: Cardiovascular Disease

## 2011-04-30 DIAGNOSIS — R0600 Dyspnea, unspecified: Secondary | ICD-10-CM

## 2011-04-30 DIAGNOSIS — R0609 Other forms of dyspnea: Secondary | ICD-10-CM

## 2011-04-30 DIAGNOSIS — R0989 Other specified symptoms and signs involving the circulatory and respiratory systems: Secondary | ICD-10-CM

## 2011-04-30 NOTE — Telephone Encounter (Signed)
Pt rtn your call he was in the middle of pulm test couldn't answer

## 2011-04-30 NOTE — Progress Notes (Signed)
PFT done today. 

## 2011-04-30 NOTE — Telephone Encounter (Signed)
Spoke with pt, aware of myoview results Chase Sanchez

## 2011-05-04 ENCOUNTER — Encounter: Payer: Self-pay | Admitting: Cardiovascular Disease

## 2011-05-08 ENCOUNTER — Telehealth: Payer: Self-pay | Admitting: Cardiovascular Disease

## 2011-05-08 MED ORDER — ALBUTEROL SULFATE HFA 108 (90 BASE) MCG/ACT IN AERS: 2.0000 | INHALATION_SPRAY | Freq: Four times a day (QID) | RESPIRATORY_TRACT | Status: DC | PRN

## 2011-05-08 NOTE — Telephone Encounter (Signed)
Pt calling to get results of pt pulmonary test. Please return pt call to advise/discuss.

## 2011-05-08 NOTE — Telephone Encounter (Signed)
Spoke with pt, aware of PFT results. Albuterol inh called in as ordered by dr Verdis Prime

## 2011-05-14 ENCOUNTER — Encounter: Payer: Self-pay | Admitting: Cardiovascular Disease

## 2011-06-06 ENCOUNTER — Other Ambulatory Visit: Payer: Self-pay | Admitting: *Deleted

## 2011-06-06 MED ORDER — METFORMIN HCL 500 MG PO TABS: 500.0000 mg | ORAL_TABLET | Freq: Two times a day (BID) | ORAL | Status: DC

## 2011-06-06 MED ORDER — LOVASTATIN 40 MG PO TABS: 40.0000 mg | ORAL_TABLET | Freq: Every day | ORAL | Status: DC

## 2011-06-06 NOTE — Progress Notes (Signed)
Addended by: Merrilyn Puma on: 06/06/2011 03:33 PM   Modules accepted: Orders

## 2011-06-07 ENCOUNTER — Telehealth: Payer: Self-pay | Admitting: *Deleted

## 2011-06-07 NOTE — Telephone Encounter (Signed)
Message copied by Merrilyn Puma on Thu Jun 07, 2011  5:02 PM ------      Message from: COUSIN, SHARON T      Created: Thu Jun 07, 2011  8:51 AM       Jannae Fagerstrom-Per pt he has switched to [facility]--He is no longer a pt here.--You are welcome      ----- Message -----         From: Lanier Prude, CMA         Sent: 06/06/2011   3:31 PM           To: Epimenio Sarin, #            Please sched OV with PCP. Last OV was 12/2009 and he wanted him back in 04/2010.            Thanks!!!

## 2011-10-09 ENCOUNTER — Emergency Department (HOSPITAL_COMMUNITY)
Admission: EM | Admit: 2011-10-09 | Discharge: 2011-10-09 | Disposition: A | Discharge status: Home / Self Care | Payer: BC Managed Care – PPO | Attending: Emergency Medicine | Admitting: Emergency Medicine

## 2011-10-09 ENCOUNTER — Emergency Department (INDEPENDENT_AMBULATORY_CARE_PROVIDER_SITE_OTHER)
Admission: EM | Admit: 2011-10-09 | Discharge: 2011-10-09 | Disposition: A | Discharge status: Nursing Home (Includes ICF) | Payer: BC Managed Care – PPO | Source: Home / Self Care | Attending: Emergency Medicine | Admitting: Emergency Medicine

## 2011-10-09 ENCOUNTER — Encounter (HOSPITAL_COMMUNITY): Payer: Self-pay | Admitting: *Deleted

## 2011-10-09 ENCOUNTER — Encounter (HOSPITAL_COMMUNITY): Payer: Self-pay | Admitting: Emergency Medicine

## 2011-10-09 ENCOUNTER — Emergency Department (INDEPENDENT_AMBULATORY_CARE_PROVIDER_SITE_OTHER): Payer: BC Managed Care – PPO

## 2011-10-09 DIAGNOSIS — I2510 Atherosclerotic heart disease of native coronary artery without angina pectoris: Secondary | ICD-10-CM | POA: Insufficient documentation

## 2011-10-09 DIAGNOSIS — F17200 Nicotine dependence, unspecified, uncomplicated: Secondary | ICD-10-CM | POA: Insufficient documentation

## 2011-10-09 DIAGNOSIS — Z7982 Long term (current) use of aspirin: Secondary | ICD-10-CM | POA: Insufficient documentation

## 2011-10-09 DIAGNOSIS — K5289 Other specified noninfective gastroenteritis and colitis: Secondary | ICD-10-CM

## 2011-10-09 DIAGNOSIS — K529 Noninfective gastroenteritis and colitis, unspecified: Secondary | ICD-10-CM

## 2011-10-09 DIAGNOSIS — K219 Gastro-esophageal reflux disease without esophagitis: Secondary | ICD-10-CM | POA: Insufficient documentation

## 2011-10-09 DIAGNOSIS — Z8719 Personal history of other diseases of the digestive system: Secondary | ICD-10-CM | POA: Insufficient documentation

## 2011-10-09 DIAGNOSIS — E119 Type 2 diabetes mellitus without complications: Secondary | ICD-10-CM | POA: Insufficient documentation

## 2011-10-09 DIAGNOSIS — E785 Hyperlipidemia, unspecified: Secondary | ICD-10-CM | POA: Insufficient documentation

## 2011-10-09 DIAGNOSIS — F411 Generalized anxiety disorder: Secondary | ICD-10-CM | POA: Insufficient documentation

## 2011-10-09 DIAGNOSIS — Z9889 Other specified postprocedural states: Secondary | ICD-10-CM | POA: Insufficient documentation

## 2011-10-09 DIAGNOSIS — K439 Ventral hernia without obstruction or gangrene: Secondary | ICD-10-CM | POA: Insufficient documentation

## 2011-10-09 LAB — DIFFERENTIAL
Eosinophils Absolute: 0.1 10*3/uL (ref 0.0–0.7)
Eosinophils Absolute: 0 10*3/uL (ref 0.0–0.7)
Lymphocytes Relative: 4 % — ABNORMAL LOW (ref 12–46)
Lymphs Abs: 0.9 10*3/uL (ref 0.7–4.0)
Lymphs Abs: 0.9 10*3/uL (ref 0.7–4.0)
Monocytes Relative: 6 % (ref 3–12)
Monocytes Relative: 4 % (ref 3–12)
Neutro Abs: 18 10*3/uL — ABNORMAL HIGH (ref 1.7–7.7)
Neutro Abs: 11.6 10*3/uL — ABNORMAL HIGH (ref 1.7–7.7)
Neutrophils Relative %: 89 % — ABNORMAL HIGH (ref 43–77)
Neutrophils Relative %: 89 % — ABNORMAL HIGH (ref 43–77)

## 2011-10-09 LAB — HIV ANTIBODY (ROUTINE TESTING W REFLEX): HIV: NONREACTIVE

## 2011-10-09 LAB — CBC
Hemoglobin: 17.1 g/dL — ABNORMAL HIGH (ref 13.0–17.0)
Hemoglobin: 15.3 g/dL (ref 13.0–17.0)
MCH: 29 pg (ref 26.0–34.0)
Platelets: 206 10*3/uL (ref 150–400)
Platelets: 168 10*3/uL (ref 150–400)
RBC: 5.76 MIL/uL (ref 4.22–5.81)
RBC: 5.27 MIL/uL (ref 4.22–5.81)
WBC: 20.3 10*3/uL — ABNORMAL HIGH (ref 4.0–10.5)
WBC: 13.1 10*3/uL — ABNORMAL HIGH (ref 4.0–10.5)

## 2011-10-09 LAB — URINALYSIS, ROUTINE W REFLEX MICROSCOPIC
Bilirubin Urine: NEGATIVE
Glucose, UA: NEGATIVE mg/dL
Ketones, ur: NEGATIVE mg/dL
Protein, ur: NEGATIVE mg/dL
Urobilinogen, UA: 0.2 mg/dL (ref 0.0–1.0)

## 2011-10-09 LAB — POCT I-STAT, CHEM 8
BUN: 13 mg/dL (ref 6–23)
Calcium, Ion: 1.2 mmol/L (ref 1.12–1.32)
Chloride: 105 mEq/L (ref 96–112)
Creatinine, Ser: 0.7 mg/dL (ref 0.50–1.35)
Creatinine, Ser: 0.7 mg/dL (ref 0.50–1.35)
HCT: 45 % (ref 39.0–52.0)
Hemoglobin: 18.4 g/dL — ABNORMAL HIGH (ref 13.0–17.0)
Hemoglobin: 15.3 g/dL (ref 13.0–17.0)
Potassium: 3.8 mEq/L (ref 3.5–5.1)
Sodium: 138 mEq/L (ref 135–145)
Sodium: 137 mEq/L (ref 135–145)
TCO2: 25 mmol/L (ref 0–100)

## 2011-10-09 LAB — CLOSTRIDIUM DIFFICILE BY PCR: Toxigenic C. Difficile by PCR: NEGATIVE

## 2011-10-09 LAB — URINE MICROSCOPIC-ADD ON

## 2011-10-09 LAB — HEPATIC FUNCTION PANEL
ALT: 19 U/L (ref 0–53)
AST: 14 U/L (ref 0–37)
Albumin: 4 g/dL (ref 3.5–5.2)
Bilirubin, Direct: 0.1 mg/dL (ref 0.0–0.3)
Total Protein: 7.7 g/dL (ref 6.0–8.3)

## 2011-10-09 MED ORDER — ONDANSETRON HCL 4 MG/2ML IJ SOLN
INTRAMUSCULAR | Status: AC
  Filled 2011-10-09: qty 2

## 2011-10-09 MED ORDER — FENTANYL CITRATE 0.05 MG/ML IJ SOLN
50.0000 ug | Freq: Once | INTRAMUSCULAR | Status: AC
  Administered 2011-10-09: 50 ug via INTRAVENOUS
  Filled 2011-10-09: qty 2

## 2011-10-09 MED ORDER — SODIUM CHLORIDE 0.9 % IV BOLUS (SEPSIS)
1000.0000 mL | Freq: Once | INTRAVENOUS | Status: AC
  Administered 2011-10-09: 1000 mL via INTRAVENOUS

## 2011-10-09 MED ORDER — ONDANSETRON HCL 4 MG/2ML IJ SOLN
4.0000 mg | Freq: Once | INTRAMUSCULAR | Status: AC
  Administered 2011-10-09: 4 mg via INTRAVENOUS
  Filled 2011-10-09: qty 2

## 2011-10-09 MED ORDER — SODIUM CHLORIDE 0.9 % IV SOLN
INTRAVENOUS | Status: DC
  Administered 2011-10-09: 12:00:00 via INTRAVENOUS

## 2011-10-09 MED ORDER — SODIUM CHLORIDE 0.9 % IV SOLN
INTRAVENOUS | Status: DC
  Administered 2011-10-09 (×2): via INTRAVENOUS

## 2011-10-09 MED ORDER — ONDANSETRON HCL 4 MG/2ML IJ SOLN
4.0000 mg | Freq: Once | INTRAMUSCULAR | Status: AC
  Administered 2011-10-09: 4 mg via INTRAVENOUS

## 2011-10-09 MED ORDER — ONDANSETRON 8 MG PO TBDP: 8.0000 mg | ORAL_TABLET | Freq: Three times a day (TID) | ORAL | Status: AC | PRN

## 2011-10-09 NOTE — ED Notes (Signed)
Given blnakets

## 2011-10-09 NOTE — ED Provider Notes (Signed)
Chief Complaint  Patient presents with  . Diarrhea    History of Present Illness:   The patient is a 51 year old male who since this morning has had frequent, watery diarrhea with as many as 8 stools up until the time he was seen here. The diarrhea was clear and watery without any blood or mucus. He felt nauseated but did not vomit. He has some mild crampy abdominal pain and borborygmus. He felt chilled but didn't have a fever. He feels somewhat dizzy, weak, and washed out. He's had no exposures to any sick contacts. The patient's had no suspicious ingestions, foreign travel or animal exposure.  The patient has multiple polyposis syndrome and underwent a subtotal colectomy with a J-pouch in November of last year at [facility]. One week ago he had a flexible sigmoidoscopy and 24 polyps were removed.  Review of Systems:  Other than noted above, the patient denies any of the following symptoms: Constitutional:  No fever, chills, fatigue, weight loss or anorexia. Lungs:  No cough or shortness of breath. Heart:  No chest pain, palpitations, syncope or edema. Abdomen:  No nausea, vomiting, hematememesis, melena, diarrhea, or hematochezia. GU:  No dysuria, frequency, urgency, or hematuria. Skin:  No rash or itching.  PMFSH:  Past medical history, family history, social history, meds, and allergies were reviewed.  Physical Exam:   Vital signs:  BP 105/62  Pulse 99  Temp(Src) 97.4 F (36.3 C) (Oral)  Resp 24  SpO2 100% Gen:  Alert, oriented, in no distress. Lungs:  Breath sounds clear and equal bilaterally.  No wheezes, rales or rhonchi. Heart:  Regular rhythm.  No gallops or murmers.   Abdomen:  Abdomen was soft, flat, and nondistended. He has mild generalized pain to palpation. No guarding or rebound. No localized pain to palpation. No organomegaly or mass. Bowel sounds are hyperactive. Skin:  Clear, warm and dry.  No rash.  Labs:   Results for orders placed during the  hospital encounter of 10/09/11  CBC      Component Value Range   WBC 20.3 (*) 4.0 - 10.5 (K/uL)   RBC 5.76  4.22 - 5.81 (MIL/uL)   Hemoglobin 17.1 (*) 13.0 - 17.0 (g/dL)   HCT 14.7  82.9 - 56.2 (%)   MCV 83.0  78.0 - 100.0 (fL)   MCH 29.7  26.0 - 34.0 (pg)   MCHC 35.8  30.0 - 36.0 (g/dL)   RDW 13.0  86.5 - 78.4 (%)   Platelets 206  150 - 400 (K/uL)  DIFFERENTIAL      Component Value Range   Neutrophils Relative 89 (*) 43 - 77 (%)   Neutro Abs 18.0 (*) 1.7 - 7.7 (K/uL)   Lymphocytes Relative 4 (*) 12 - 46 (%)   Lymphs Abs 0.9  0.7 - 4.0 (K/uL)   Monocytes Relative 6  3 - 12 (%)   Monocytes Absolute 1.3 (*) 0.1 - 1.0 (K/uL)   Eosinophils Relative 1  0 - 5 (%)   Eosinophils Absolute 0.1  0.0 - 0.7 (K/uL)   Basophils Relative 0  0 - 1 (%)   Basophils Absolute 0.0  0.0 - 0.1 (K/uL)  POCT I-STAT, CHEM 8      Component Value Range   Sodium 138  135 - 145 (mEq/L)   Potassium 4.5  3.5 - 5.1 (mEq/L)   Chloride 103  96 - 112 (mEq/L)   BUN 13  6 - 23 (mg/dL)   Creatinine, Ser 6.96  0.50 - 1.35 (mg/dL)   Glucose, Bld 409 (*) 70 - 99 (mg/dL)   Calcium, Ion 8.11  1.12 - 1.32 (mmol/L)   TCO2 25  0 - 100 (mmol/L)   Hemoglobin 18.4 (*) 13.0 - 17.0 (g/dL)   HCT 91.4 (*) 78.2 - 52.0 (%)     Radiology:  Dg Abd Acute W/chest  10/09/2011  *RADIOLOGY REPORT*  Clinical Data: 51 year old male with nausea, vomiting, diarrhea.  ACUTE ABDOMEN SERIES (ABDOMEN 2 VIEW & CHEST 1 VIEW)  Comparison: CT abdomen and pelvis 12/13/2004.  Findings: Lung volumes are within normal limits.  Cardiac size and mediastinal contours are within normal limits.  Visualized tracheal air column is within normal limits.  No pneumothorax or pneumoperitoneum.  The lungs are clear.  Postoperative changes of both shoulders.  The fairly gasless abdomen.  Small volume of gas and to a normal caliber the ascending and descending colon.  Abdominal and pelvic visceral contours are within normal limits.  No dilated loops are evident. No acute  osseous abnormality identified.  IMPRESSION: 1.  Paucity of bowel gas limits the evaluation of obstruction, but no dilated loops are evident.  No free air. 2. No acute cardiopulmonary abnormality.  Original Report Authenticated By: Harley Hallmark, M.D.   Course in Urgent Care Center:   The patient was given a liter of IV normal saline and Zofran 4 mg IV. Thereafter he felt minimally less nauseated, but still felt very weak, chilled, dizzy, and continued to have frequent loose stools even here at the urgent care Center.    Assessment:   Diagnoses that have been ruled out:  None  Diagnoses that are still under consideration:  None  Final diagnoses:  Gastroenteritis    Plan:   1.  The patient will be sent by [facility] to the emergency department.  Roque Lias, MD 10/09/11 [phone]

## 2011-10-09 NOTE — ED Provider Notes (Signed)
History     CSN: [id number]  Arrival date & time 10/09/11  1332   First MD Initiated Contact with Patient 10/09/11 1501     3:05 PM HPI Chase Sanchez is a 51 y.o. male who was transferred from [facility] urgent care center. Patient complaining of persistent watery diarrhea that began today. Reports since waking up this morning has had greater than 20 stools. Symptoms associated with mild abdominal cramping, and nausea, weakness, dizziness, near-syncope. Patient reports a significant history of having multiple polyps removed one week ago. States has been feeling normal until today. Reports a history of loose stools since colectomy. States normally has 3-4 normal stools a day. Denies change in medication, foreign travel, positive sick contacts, positive food contacts, hematochezia, fever or vomiting.  Patient is a 51 y.o. male presenting with diarrhea. The history is provided by the patient and the spouse.  Diarrhea The primary symptoms include abdominal pain, nausea and diarrhea. Primary symptoms do not include fever, vomiting, melena, hematemesis or dysuria. The illness began today. The onset was sudden. The problem has not changed since onset. The abdominal pain began today. The abdominal pain has been unchanged since its onset. The abdominal pain is generalized. The abdominal pain does not radiate. The severity of the abdominal pain is 2/10. The abdominal pain is relieved by bowel movements.  The diarrhea began today. The diarrhea is watery. The diarrhea occurs more than 10 times per day.  The illness is also significant for chills. The illness does not include constipation or back pain. Associated medical issues comments: colectomy.    Past Medical History  Diagnosis Date  . Anxiety state, unspecified   . Ventral hernia, unspecified, without mention of obstruction or gangrene   . Type II or unspecified type diabetes mellitus without mention of complication, not stated as uncontrolled   .  Coronary atherosclerosis of unspecified type of vessel, native or graft   . Esophageal reflux   . Personal history of other diseases of digestive system   . Other and unspecified hyperlipidemia   . Other testicular hypofunction   . Family history of diabetes mellitus   . Tobacco use disorder   . Colon polyposis     Past Surgical History  Procedure Date  . Septoplasty   . Appendectomy   . Tonsillectomy   . Rotator cuff repair     right and left    Family History  Problem Relation Age of Onset  . Diabetes Other   . Cancer Mother     lung  . Cancer Father     prostate    History  Substance Use Topics  . Smoking status: Current Everyday Smoker  . Smokeless tobacco: Not on file  . Alcohol Use: No      Review of Systems  Constitutional: Positive for chills. Negative for fever.  Respiratory: Negative for shortness of breath.   Cardiovascular: Negative for chest pain.  Gastrointestinal: Positive for nausea, abdominal pain and diarrhea. Negative for vomiting, constipation, blood in stool, melena, rectal pain and hematemesis.  Genitourinary: Negative for dysuria, hematuria, flank pain, discharge, penile pain and testicular pain.  Musculoskeletal: Negative for back pain.  Neurological: Negative for dizziness, weakness, numbness and headaches.  All other systems reviewed and are negative.    Allergies  Codeine; Iohexol; and Ivp dye  Home Medications   Current Outpatient Rx  Name Route Sig Dispense Refill  . ALBUTEROL SULFATE HFA 108 (90 BASE) MCG/ACT IN AERS Inhalation Inhale 2 puffs into  the lungs every 6 (six) hours as needed for wheezing. 1 Inhaler 12  . ASPIRIN 81 MG PO TABS Oral Take 81 mg by mouth daily.      Marland Kitchen COLACE PO Oral Take by mouth as needed.      Marland Kitchen FLUTICASONE PROPIONATE 50 MCG/ACT NA SUSP Nasal 1 spray by Nasal route as needed.      . GUAIFENESIN ER 600 MG PO TB12 Oral Take by mouth as directed.      Marland Kitchen LORAZEPAM 1 MG PO TABS Oral Take 2 tablets (2 mg  total) by mouth as needed. 60 tablet 3  . LOVASTATIN 40 MG PO TABS Oral Take 1 tablet (40 mg total) by mouth daily. 90 tablet 0    Pt needs OV for further refills.  . METFORMIN HCL 500 MG PO TABS Oral Take 1 tablet (500 mg total) by mouth 2 (two) times daily with a meal. 180 tablet 0    Pt needs OV for future refills.  Marland Kitchen POLYETHYLENE GLYCOL 3350 PO PACK Oral Take 17 g by mouth at bedtime.      Marland Kitchen RANITIDINE HCL 300 MG PO TABS Oral Take 300 mg by mouth as needed.      Marland Kitchen SILODOSIN 8 MG PO CAPS Oral Take 1 capsule by mouth daily.        BP 135/78  Temp(Src) 97.9 F (36.6 C) (Oral)  Resp 20  SpO2 97%  Physical Exam  Vitals reviewed. Constitutional: He is oriented to person, place, and time. He appears well-developed and well-nourished.  HENT:  Head: Normocephalic and atraumatic.  Eyes: Conjunctivae are normal. Pupils are equal, round, and reactive to light.  Neck: Normal range of motion. Neck supple.  Cardiovascular: Normal rate, regular rhythm and normal heart sounds.  Exam reveals no gallop and no friction rub.   No murmur heard. Pulmonary/Chest: Effort normal and breath sounds normal. No respiratory distress. He has no wheezes. He has no rales. He exhibits no tenderness.  Abdominal: Soft. Bowel sounds are normal. He exhibits no distension and no mass. There is no tenderness. There is no rebound and no guarding.  Neurological: He is alert and oriented to person, place, and time.  Skin: Skin is warm and dry. No rash noted. No erythema. No pallor.  Psychiatric: He has a normal mood and affect. His behavior is normal.    ED Course  Procedures  Results for orders placed during the hospital encounter of 10/09/11  CBC      Component Value Range   WBC 20.3 (*) 4.0 - 10.5 (K/uL)   RBC 5.76  4.22 - 5.81 (MIL/uL)   Hemoglobin 17.1 (*) 13.0 - 17.0 (g/dL)   HCT 16.1  09.6 - 04.5 (%)   MCV 83.0  78.0 - 100.0 (fL)   MCH 29.7  26.0 - 34.0 (pg)   MCHC 35.8  30.0 - 36.0 (g/dL)   RDW 40.9   81.1 - 91.4 (%)   Platelets 206  150 - 400 (K/uL)  DIFFERENTIAL      Component Value Range   Neutrophils Relative 89 (*) 43 - 77 (%)   Neutro Abs 18.0 (*) 1.7 - 7.7 (K/uL)   Lymphocytes Relative 4 (*) 12 - 46 (%)   Lymphs Abs 0.9  0.7 - 4.0 (K/uL)   Monocytes Relative 6  3 - 12 (%)   Monocytes Absolute 1.3 (*) 0.1 - 1.0 (K/uL)   Eosinophils Relative 1  0 - 5 (%)   Eosinophils Absolute 0.1  0.0 -  0.7 (K/uL)   Basophils Relative 0  0 - 1 (%)   Basophils Absolute 0.0  0.0 - 0.1 (K/uL)  POCT I-STAT, CHEM 8      Component Value Range   Sodium 138  135 - 145 (mEq/L)   Potassium 4.5  3.5 - 5.1 (mEq/L)   Chloride 103  96 - 112 (mEq/L)   BUN 13  6 - 23 (mg/dL)   Creatinine, Ser 1.61  0.50 - 1.35 (mg/dL)   Glucose, Bld 096 (*) 70 - 99 (mg/dL)   Calcium, Ion 0.45  1.12 - 1.32 (mmol/L)   TCO2 25  0 - 100 (mmol/L)   Hemoglobin 18.4 (*) 13.0 - 17.0 (g/dL)   HCT 40.9 (*) 81.1 - 52.0 (%)    Dg Abd Acute W/chest  10/09/2011  *RADIOLOGY REPORT*  Clinical Data: 51 year old male with nausea, vomiting, diarrhea.  ACUTE ABDOMEN SERIES (ABDOMEN 2 VIEW & CHEST 1 VIEW)  Comparison: CT abdomen and pelvis 12/13/2004.  Findings: Lung volumes are within normal limits.  Cardiac size and mediastinal contours are within normal limits.  Visualized tracheal air column is within normal limits.  No pneumothorax or pneumoperitoneum.  The lungs are clear.  Postoperative changes of both shoulders.  The fairly gasless abdomen.  Small volume of gas and to a normal caliber the ascending and descending colon.  Abdominal and pelvic visceral contours are within normal limits.  No dilated loops are evident. No acute osseous abnormality identified.  IMPRESSION: 1.  Paucity of bowel gas limits the evaluation of obstruction, but no dilated loops are evident.  No free air. 2. No acute cardiopulmonary abnormality.  Original Report Authenticated By: Harley Hallmark, M.D.      MDM   4:19 PM Patient reports 2 BMs this past hour.  Continues to feel weak.  7:45 PM Patient reports decreased bowel movements. States he has maybe had 2 bowel movements in the past 3 hours. We'll repeat CBC and i-STAT to check for improvement. Patient still reports that he feels weak  10:44 PM This reports significant improvement of symptoms. Has not had a bowel movement last assessment. Has been able to tolerate water and crackers. Reports he continues to feel weak but this has improved as well. Advised brat diet at home, fluid hydration at home. Discussed pending stool culture and ova and parasites. Advised patient to followup with primary care physician for further evaluation if needed and to mail is return to emergency room for worsening symptoms. Patient and family agree with plan and are ready for discharge.   Thomasene Lot, PA-C 10/09/11 2246

## 2011-10-09 NOTE — ED Notes (Signed)
Pt stated that he had polyps removed from his rectum and colon a couple of days ago. He stated that ever since then he has been having chills, nausea, vomiting, and stomach pains. No CP or respiratory distress. No back pain. No changes in urination. Will continue to monitor.

## 2011-10-09 NOTE — ED Notes (Signed)
The Prearrival information was placed on wrong pt and unable to find a way to delete it

## 2011-10-09 NOTE — ED Notes (Signed)
No truck available per [facility]

## 2011-10-09 NOTE — Discharge Instructions (Signed)
We have determined that your problem requires further evaluation in the emergency department.  We will take care of your transport there.  Once at the emergency department, you will be evaluated by a provider and they will order whatever treatment or tests they deem necessary.  We cannot guarantee that they will do any specific test or do any specific treatment.  ° °

## 2011-10-09 NOTE — ED Notes (Signed)
Given pillow

## 2011-10-09 NOTE — ED Notes (Signed)
Notified [facility] 

## 2011-10-09 NOTE — ED Notes (Signed)
Patient transported to X-ray  Patient remains in xray 

## 2011-10-09 NOTE — ED Notes (Signed)
Reports waking this am with abdominal pain, diarrhea, and nausea.  C/o abdomen feeling bloated, "gripey".  Denies eating this am.  C/o feeling like he will pass out.  C/o being cold.  Reports stool is water diarrhea, clear.  Patient had intestinal polyps removed one week ago at [facility].

## 2011-10-09 NOTE — ED Notes (Signed)
Patient with c/o abdominal pain and diarrhea.  Patient states polyps removed yesterday and today experiencing pain and diarrhea.  Patient with history of colon surgery and polyps removal.  Patient received Zofran pta and abdominal has subsided.

## 2011-10-09 NOTE — Discharge Instructions (Signed)
Viral Gastroenteritis Viral gastroenteritis is also known as stomach flu. This condition affects the stomach and intestinal tract. It can cause sudden diarrhea and vomiting. The illness typically lasts 3 to 8 days. Most people develop an immune response that eventually gets rid of the virus. While this natural response develops, the virus can make you quite ill. CAUSES  Many different viruses can cause gastroenteritis, such as rotavirus or noroviruses. You can catch one of these viruses by consuming contaminated food or water. You may also catch a virus by sharing utensils or other personal items with an infected person or by touching a contaminated surface. SYMPTOMS  The most common symptoms are diarrhea and vomiting. These problems can cause a severe loss of body fluids (dehydration) and a body salt (electrolyte) imbalance. Other symptoms may include:  Fever.   Headache.   Fatigue.   Abdominal pain.  DIAGNOSIS  Your caregiver can usually diagnose viral gastroenteritis based on your symptoms and a physical exam. A stool sample may also be taken to test for the presence of viruses or other infections. TREATMENT  This illness typically goes away on its own. Treatments are aimed at rehydration. The most serious cases of viral gastroenteritis involve vomiting so severely that you are not able to keep fluids down. In these cases, fluids must be given through an intravenous line (IV). HOME CARE INSTRUCTIONS   Drink enough fluids to keep your urine clear or pale yellow. Drink small amounts of fluids frequently and increase the amounts as tolerated.   Ask your caregiver for specific rehydration instructions.   Avoid:   Foods high in sugar.   Alcohol.   Carbonated drinks.   Tobacco.   Juice.   Caffeine drinks.   Extremely hot or cold fluids.   Fatty, greasy foods.   Too much intake of anything at one time.   Dairy products until 24 to 48 hours after diarrhea stops.   You may  consume probiotics. Probiotics are active cultures of beneficial bacteria. They may lessen the amount and number of diarrheal stools in adults. Probiotics can be found in yogurt with active cultures and in supplements.   Wash your hands well to avoid spreading the virus.   Only take over-the-counter or prescription medicines for pain, discomfort, or fever as directed by your caregiver. Do not give aspirin to children. Antidiarrheal medicines are not recommended.   Ask your caregiver if you should continue to take your regular prescribed and over-the-counter medicines.   Keep all follow-up appointments as directed by your caregiver.  SEEK IMMEDIATE MEDICAL CARE IF:   You are unable to keep fluids down.   You do not urinate at least once every 6 to 8 hours.   You develop shortness of breath.   You notice blood in your stool or vomit. This may look like coffee grounds.   You have abdominal pain that increases or is concentrated in one small area (localized).   You have persistent vomiting or diarrhea.   You have a fever.   The patient is a child younger than 3 months, and he or she has a fever.   The patient is a child older than 3 months, and he or she has a fever and persistent symptoms.   The patient is a child older than 3 months, and he or she has a fever and symptoms suddenly get worse.   The patient is a baby, and he or she has no tears when crying.  MAKE SURE YOU:     Understand these instructions.   Will watch your condition.   Will get help right away if you are not doing well or get worse.  Document Released: 07/23/2005 Document Revised: 07/12/2011 Document Reviewed: 05/09/2011 [facility] Patient Information 2012 [place], [place].  Diet for Diarrhea, Adult Having frequent, runny stools (diarrhea) has many causes. Diarrhea may be caused or worsened by food or drink. Diarrhea may be relieved by changing your diet. IF YOU ARE NOT TOLERATING SOLID FOODS:  Drink enough  water and fluids to keep your urine clear or pale yellow.   Avoid sugary drinks and sodas as well as milk-based beverages.   Avoid beverages containing caffeine and alcohol.   You may try rehydrating beverages. You can make your own by following this recipe:    tsp table salt.    tsp baking soda.   ? tsp salt substitute (potassium chloride).   1 tbs + 1 tsp sugar.   1 qt water.  As your stools become more solid, you can start eating solid foods. Add foods one at a time. If a certain food causes your diarrhea to get worse, avoid that food and try other foods. A low fiber, low-fat, and lactose-free diet is recommended. Small, frequent meals may be better tolerated.  Starches  Allowed:  White, [place], and pita breads, plain rolls, buns, bagels. Plain muffins, matzo. Soda, saltine, or graham crackers. Pretzels, melba toast, zwieback. Cooked cereals made with water: cornmeal, farina, cream cereals. Dry cereals: refined corn, wheat, rice. Potatoes prepared any way without skins, refined macaroni, spaghetti, noodles, refined rice.   Avoid:  Bread, rolls, or crackers made with whole wheat, multi-grains, rye, bran seeds, nuts, or coconut. Corn tortillas or taco shells. Cereals containing whole grains, multi-grains, bran, coconut, nuts, or raisins. Cooked or dry oatmeal. Coarse wheat cereals, granola. Cereals advertised as "high-fiber." Potato skins. Whole grain pasta, wild or brown rice. Popcorn. Sweet potatoes/yams. Sweet rolls, doughnuts, waffles, pancakes, sweet breads.  Vegetables  Allowed: Strained tomato and vegetable juices. Most well-cooked and canned vegetables without seeds. Fresh: Tender lettuce, cucumber without the skin, cabbage, spinach, bean sprouts.   Avoid: Fresh, cooked, or canned: Artichokes, baked beans, beet greens, broccoli, Brussels sprouts, corn, kale, legumes, peas, sweet potatoes. Cooked: Green or red cabbage, spinach. Avoid large servings of any vegetables, because  vegetables shrink when cooked, and they contain more fiber per serving than fresh vegetables.  Fruit  Allowed: All fruit juices except prune juice. Cooked or canned: Apricots, applesauce, cantaloupe, cherries, fruit cocktail, grapefruit, grapes, kiwi, mandarin oranges, peaches, pears, plums, watermelon. Fresh: Apples without skin, ripe banana, grapes, cantaloupe, cherries, grapefruit, peaches, oranges, plums. Keep servings limited to  cup or 1 piece.   Avoid: Fresh: Apple with skin, apricots, mango, pears, raspberries, strawberries. Prune juice, stewed or dried prunes. Dried fruits, raisins, dates. Large servings of all fresh fruits.  Meat and Meat Substitutes  Allowed: Ground or well-cooked tender beef, ham, veal, lamb, pork, or poultry. Eggs, plain cheese. Fish, oysters, shrimp, lobster, other seafoods. Liver, organ meats.   Avoid: Tough, fibrous meats with gristle. Peanut butter, smooth or chunky. Cheese, nuts, seeds, legumes, dried peas, beans, lentils.  Milk  Allowed: Yogurt, lactose-free milk, kefir, drinkable yogurt, buttermilk, soy milk.   Avoid: Milk, chocolate milk, beverages made with milk, such as milk shakes.  Soups  Allowed: Bouillon, broth, or soups made from allowed foods. Any strained soup.   Avoid: Soups made from vegetables that are not allowed, cream or milk-based soups.  Desserts and Sweets  Allowed: Sugar-free gelatin, sugar-free frozen ice pops made without sugar alcohol.   Avoid: Plain cakes and cookies, pie made with allowed fruit, pudding, custard, cream pie. Gelatin, fruit, ice, sherbet, frozen ice pops. Ice cream, ice milk without nuts. Plain hard candy, honey, jelly, molasses, syrup, sugar, chocolate syrup, gumdrops, marshmallows.  Fats and Oils  Allowed: Avoid any fats and oils.   Avoid: Seeds, nuts, olives, avocados. Margarine, butter, cream, mayonnaise, salad oils, plain salad dressings made from allowed foods. Plain gravy, crisp bacon without rind.    Beverages  Allowed: Water, decaffeinated teas, oral rehydration solutions, sugar-free beverages.   Avoid: Fruit juices, caffeinated beverages (coffee, tea, soda or pop), alcohol, sports drinks, or lemon-lime soda or pop.  Condiments  Allowed: Ketchup, mustard, horseradish, vinegar, cream sauce, cheese sauce, cocoa powder. Spices in moderation: allspice, basil, bay leaves, celery powder or leaves, cinnamon, cumin powder, curry powder, ginger, mace, marjoram, onion or garlic powder, oregano, paprika, parsley flakes, ground pepper, rosemary, sage, savory, tarragon, thyme, turmeric.   Avoid: Coconut, honey.  Weight Monitoring: Weigh yourself every day. You should weigh yourself in the morning after you urinate and before you eat breakfast. Wear the same amount of clothing when you weigh yourself. Record your weight daily. Bring your recorded weights to your clinic visits. Tell your caregiver right away if you have gained 3 lb/1.4 kg or more in 1 day, 5 lb/2.3 kg in a week, or whatever amount you were told to report. SEEK IMMEDIATE MEDICAL CARE IF:   You are unable to keep fluids down.   You start to throw up (vomit) or diarrhea keeps coming back (persistent).   Abdominal pain develops, increases, or can be felt in one place (localizes).   You have an oral temperature above 102 F (38.9 C), not controlled by medicine.   Diarrhea contains blood or mucus.   You develop excessive weakness, dizziness, fainting, or extreme thirst.  MAKE SURE YOU:   Understand these instructions.   Will watch your condition.   Will get help right away if you are not doing well or get worse.  Document Released: 10/13/2003 Document Revised: 07/12/2011 Document Reviewed: 02/03/2009 [facility] Patient Information 2012 [place], [place].

## 2011-10-10 NOTE — ED Provider Notes (Signed)
Medical screening examination/treatment/procedure(s) were conducted as a shared visit with non-physician practitioner(s) and myself.  I personally evaluated the patient during the encounter   Loren Racer, MD 10/10/11 2041

## 2011-10-11 LAB — OVA AND PARASITE EXAMINATION: Ova and parasites: NONE SEEN

## 2011-10-13 LAB — STOOL CULTURE

## 2013-10-18 ENCOUNTER — Encounter (HOSPITAL_COMMUNITY): Payer: Self-pay | Admitting: Emergency Medicine

## 2013-10-18 ENCOUNTER — Emergency Department (HOSPITAL_COMMUNITY): Payer: BC Managed Care – PPO

## 2013-10-18 ENCOUNTER — Emergency Department (HOSPITAL_COMMUNITY)
Admission: EM | Admit: 2013-10-18 | Discharge: 2013-10-18 | Disposition: A | Discharge status: Home / Self Care | Payer: BC Managed Care – PPO | Attending: Emergency Medicine | Admitting: Emergency Medicine

## 2013-10-18 DIAGNOSIS — I2510 Atherosclerotic heart disease of native coronary artery without angina pectoris: Secondary | ICD-10-CM | POA: Insufficient documentation

## 2013-10-18 DIAGNOSIS — Z791 Long term (current) use of non-steroidal anti-inflammatories (NSAID): Secondary | ICD-10-CM | POA: Insufficient documentation

## 2013-10-18 DIAGNOSIS — Z79899 Other long term (current) drug therapy: Secondary | ICD-10-CM | POA: Insufficient documentation

## 2013-10-18 DIAGNOSIS — F17200 Nicotine dependence, unspecified, uncomplicated: Secondary | ICD-10-CM | POA: Insufficient documentation

## 2013-10-18 DIAGNOSIS — R072 Precordial pain: Secondary | ICD-10-CM | POA: Insufficient documentation

## 2013-10-18 DIAGNOSIS — R079 Chest pain, unspecified: Secondary | ICD-10-CM

## 2013-10-18 DIAGNOSIS — Z860100 Personal history of colon polyps, unspecified: Secondary | ICD-10-CM | POA: Insufficient documentation

## 2013-10-18 DIAGNOSIS — F411 Generalized anxiety disorder: Secondary | ICD-10-CM | POA: Insufficient documentation

## 2013-10-18 DIAGNOSIS — E785 Hyperlipidemia, unspecified: Secondary | ICD-10-CM | POA: Insufficient documentation

## 2013-10-18 DIAGNOSIS — Z7982 Long term (current) use of aspirin: Secondary | ICD-10-CM | POA: Insufficient documentation

## 2013-10-18 DIAGNOSIS — Z9861 Coronary angioplasty status: Secondary | ICD-10-CM | POA: Insufficient documentation

## 2013-10-18 DIAGNOSIS — E119 Type 2 diabetes mellitus without complications: Secondary | ICD-10-CM | POA: Insufficient documentation

## 2013-10-18 DIAGNOSIS — Z8719 Personal history of other diseases of the digestive system: Secondary | ICD-10-CM | POA: Insufficient documentation

## 2013-10-18 DIAGNOSIS — Z8711 Personal history of peptic ulcer disease: Secondary | ICD-10-CM | POA: Insufficient documentation

## 2013-10-18 LAB — I-STAT TROPONIN, ED
TROPONIN I, POC: 0 ng/mL (ref 0.00–0.08)
Troponin i, poc: 0 ng/mL (ref 0.00–0.08)

## 2013-10-18 LAB — CBC
HCT: 46.9 % (ref 39.0–52.0)
HEMOGLOBIN: 17 g/dL (ref 13.0–17.0)
MCH: 30.4 pg (ref 26.0–34.0)
MCHC: 36.2 g/dL — AB (ref 30.0–36.0)
MCV: 83.9 fL (ref 78.0–100.0)
Platelets: 203 10*3/uL (ref 150–400)
RBC: 5.59 MIL/uL (ref 4.22–5.81)
RDW: 12.8 % (ref 11.5–15.5)
WBC: 16.6 10*3/uL — ABNORMAL HIGH (ref 4.0–10.5)

## 2013-10-18 LAB — BASIC METABOLIC PANEL
BUN: 11 mg/dL (ref 6–23)
CO2: 23 meq/L (ref 19–32)
Calcium: 9.2 mg/dL (ref 8.4–10.5)
Chloride: 96 mEq/L (ref 96–112)
Creatinine, Ser: 0.54 mg/dL (ref 0.50–1.35)
GFR calc Af Amer: >90 mL/min (ref >90)
Glucose, Bld: 222 mg/dL — ABNORMAL HIGH (ref 70–99)
POTASSIUM: 6.5 meq/L — AB (ref 3.7–5.3)
Sodium: 132 mEq/L — ABNORMAL LOW (ref 137–147)

## 2013-10-18 LAB — PRO B NATRIURETIC PEPTIDE: Pro B Natriuretic peptide (BNP): 53.3 pg/mL (ref 0–125)

## 2013-10-18 LAB — POTASSIUM: POTASSIUM: 4.1 meq/L (ref 3.7–5.3)

## 2013-10-18 MED ORDER — MORPHINE SULFATE 4 MG/ML IJ SOLN
4.0000 mg | Freq: Once | INTRAMUSCULAR | Status: AC
  Administered 2013-10-18: 4 mg via INTRAVENOUS
  Filled 2013-10-18: qty 1

## 2013-10-18 MED ORDER — ASPIRIN 81 MG PO CHEW
324.0000 mg | CHEWABLE_TABLET | Freq: Once | ORAL | Status: AC
  Administered 2013-10-18: 324 mg via ORAL
  Filled 2013-10-18: qty 4

## 2013-10-18 MED ORDER — FAMOTIDINE 20 MG PO TABS
20.0000 mg | ORAL_TABLET | Freq: Once | ORAL | Status: AC
  Administered 2013-10-18: 20 mg via ORAL
  Filled 2013-10-18: qty 1

## 2013-10-18 MED ORDER — OMEPRAZOLE 20 MG PO CPDR: 20.0000 mg | DELAYED_RELEASE_CAPSULE | Freq: Every day | ORAL | Status: DC

## 2013-10-18 NOTE — ED Notes (Signed)
Patient transported to X-ray 

## 2013-10-18 NOTE — ED Provider Notes (Signed)
CSN: [id number]     Arrival date & time 10/18/13  4098 History   First MD Initiated Contact with Patient 10/18/13 [phone]     Chief Complaint  Patient presents with  . Chest Pain   (Consider location/radiation/quality/duration/timing/severity/associated sxs/prior Treatment) The history is provided by the patient and medical records.   This is a 53 year old male with past medical history significant for diabetes, hyperlipidemia, GERD, coronary artery disease, anxiety, presenting to the ED for chest pain.  Patient states he developed midsternal chest pain around 0800, described as sharp with radiation to his back. Pain exacerbated when attempting to take a deep breath.  Patient notes transient diaphoresis and paresthesias of his left arm and hand which have since resolved.  Pt does have hx of cervical herniated discs so unsure if paresthesias due to that or his chest pain.  Denies any ripping or tearing sensations in his chest.  Denies any lightheadedness or dizziness.  No LE edema, calf pain, recent travel or surgeries.  No prior hx of DVT or PE.  Patient does not he was evaluated by GI 2 weeks ago with upper endoscopy and told that he had gastric ulcers. Patient was previously on Prilosec, but has since stopped taking this medication.  Patient is a daily smoker, but states he is trying to cut back.  Pt hypertensive on arrival.  Pt is followed by cardiology, Dr. Johnsie Cancel.  [facility] in 2001 with the following conclusions: CORONARY ARTERIOGRAPHY: (Left dominant).  Left main: Left main is normal.  Left anterior descending: The LAD has a 25% stenosis in the mid vessel. It  gives rise to a large first diagonal and a small second diagonal.  Left circumflex: The left circumflex is a dominant vessel. It gives rise to  a small OM-1 and a large OM-2. OM-2 has a 25% stenosis at its origin. The  circumflex also gives rise to a normal sized posterolateral branch and a  normal sized posterior descending artery. In the  distal left circumflex at  the origin of the posterior descending artery is a 25% stenosis.  Right coronary artery: The right coronary artery is a small nondominant  vessel. It is free of angiographic disease.  IMPRESSION:  1. Normal left ventricular systolic function.  2. Mild insignificant coronary artery disease.  DD: 12/14/99  TD: 12/16/99  Job: [id number]  [id number]   Past Medical History  Diagnosis Date  . Anxiety state, unspecified   . Ventral hernia, unspecified, without mention of obstruction or gangrene   . Type II or unspecified type diabetes mellitus without mention of complication, not stated as uncontrolled   . Coronary atherosclerosis of unspecified type of vessel, native or graft   . Esophageal reflux   . Personal history of other diseases of digestive system   . Other and unspecified hyperlipidemia   . Other testicular hypofunction   . Family history of diabetes mellitus   . Tobacco use disorder   . Colon polyposis    Past Surgical History  Procedure Laterality Date  . Septoplasty    . Appendectomy    . Tonsillectomy    . Rotator cuff repair      right and left   Family History  Problem Relation Age of Onset  . Diabetes Other   . Cancer Mother     lung  . Cancer Father     prostate   History  Substance Use Topics  . Smoking status: Current Every Day Smoker  . Smokeless tobacco: Not on file  .  Alcohol Use: No    Review of Systems  Respiratory: Positive for shortness of breath.   Cardiovascular: Positive for chest pain.  All other systems reviewed and are negative.      Allergies  Iohexol; Ivp dye; and Codeine  Home Medications   Current Outpatient Rx  Name  Route  Sig  Dispense  Refill  . aspirin EC 81 MG tablet   Oral   Take 81 mg by mouth daily.         . celecoxib (CELEBREX) 200 MG capsule   Oral   Take 200 mg by mouth daily.         Marland Kitchen glimepiride (AMARYL) 4 MG tablet   Oral   Take 4 mg by mouth daily.         Marland Kitchen  LORazepam (ATIVAN) 1 MG tablet   Oral   Take 0.5-1 mg by mouth at bedtime as needed. To help sleep.         . metFORMIN (GLUCOPHAGE) 500 MG tablet   Oral   Take 500 mg by mouth 2 (two) times daily with a meal.         . ONGLYZA 5 MG TABS tablet   Oral   Take 5 mg by mouth daily.         Marland Kitchen ULTRATRAK PRO TEST test strip   Other   1 each by Other route 2 (two) times daily.          BP 146/88  Pulse 77  Resp 20  SpO2 99%  Physical Exam  Nursing note and vitals reviewed. Constitutional: He is oriented to person, place, and time. He appears well-developed and well-nourished. No distress.  HENT:  Head: Normocephalic and atraumatic.  Mouth/Throat: Oropharynx is clear and moist.  Eyes: Conjunctivae and EOM are normal. Pupils are equal, round, and reactive to light.  Neck: Normal range of motion. Neck supple.  Cardiovascular: Normal rate, regular rhythm and normal heart sounds.   Pulmonary/Chest: Effort normal and breath sounds normal. No respiratory distress. He has no wheezes.  Abdominal: Soft. Bowel sounds are normal. There is no tenderness. There is no guarding.  Musculoskeletal: Normal range of motion. He exhibits no edema.  Neurological: He is alert and oriented to person, place, and time.  Skin: Skin is warm and dry. He is not diaphoretic.  Psychiatric: He has a normal mood and affect.    ED Course  Procedures (including critical care time) Labs Review Labs Reviewed  CBC - Abnormal; Notable for the following:    WBC 16.6 (*)    MCHC 36.2 (*)    All other components within normal limits  BASIC METABOLIC PANEL - Abnormal; Notable for the following:    Sodium 132 (*)    Glucose, Bld 222 (*)    All other components within normal limits  PRO B NATRIURETIC PEPTIDE  I-STAT TROPOININ, ED   Imaging Review Dg Chest 2 View  10/18/2013   CLINICAL DATA:  Chest pain and shortness of breath for 1 day  EXAM: CHEST  2 VIEW  COMPARISON:  CT CHEST W/O CM dated 04/11/2007   FINDINGS: The heart size and mediastinal contours are within normal limits. Both lungs are clear. The visualized skeletal structures are unremarkable. Right humeral bone anchors are noted. Minimal crowding of the bronchovascular markings is noted at the lung bases.  IMPRESSION: No focal acute process. Minimal crowding of the bronchovascular markings with presumed bilateral lower lobe atelectasis.   Electronically Signed   By:  Conchita Paris M.D.   On: 10/18/2013 10:54     EKG Interpretation   Date/Time:  Sunday October 18 2013 09:21:29 EDT Ventricular Rate:  77 PR Interval:  128 QRS Duration: 110 QT Interval:  392 QTC Calculation: 443 R Axis:   25 Text Interpretation:  Normal sinus rhythm Possible Left atrial enlargement  Incomplete right bundle branch block Borderline ECG Confirmed by DELOS   MD, DOUGLAS ([id number]) on 10/18/2013 10:12:25 AM      MDM   Final diagnoses:  Chest pain   EKG normal sinus rhythm, incomplete RBBB unchanged from previous.  Troponin negative.  Chest x-ray is clear.  After morphine and aspirin, patient is pain free.  U/s was obtained to r/o other etiology which was negative for acute findings.  Given pts hx and sx on arrival, will obtain delta trop.  Delta trop negative.  Pt remains pain free with stable VS.  At this time I have low suspicious for ACS, PE, dissection, or other acute cardiac event.  Sx possibly due to GERD.  Advised pt to re-start prilosec at home.  Hypertension has improved while in the emergency department, no signs of endorgan damage. He has previously scheduled FU with his PCP in 2 days.  Encouraged him to continue to decrease smoking.  Signs or symptoms that would warrant ED return including recurrent chest pain, shortness of breath, palpitations, dizziness, diaphoresis, or weakness were discussed, patient not understanding and agreed with plan of care.  Case discussed with Dr. Stark Jock who personally evaluated pt and agrees with assessment and plan of  care.  Larene Pickett, PA-C 10/18/13 1521  Larene Pickett, PA-C 10/18/13 [phone]

## 2013-10-18 NOTE — ED Notes (Signed)
Pt transported to ultrasound.

## 2013-10-18 NOTE — ED Notes (Signed)
Pt reports onset one hour ago of sharp mid chest pains with difficulty taking a deep breath. Pain into neck and base of skull, tingling to left arm. ekg done at triage.

## 2013-10-18 NOTE — Discharge Instructions (Signed)
Re-start your daily prilosec to help control acid reflux. Follow up with Dr. Alroy Dust in 2 days as previously scheduled. Return to the emergency department for any new or worsening symptoms including recurrent chest pain, shortness of breath, nausea, vomiting, dizziness, diaphoresis, or syncopal episodes.

## 2013-10-18 NOTE — ED Provider Notes (Signed)
Medical screening examination/treatment/procedure(s) were conducted as a shared visit with non-physician practitioner(s) and myself.  I personally evaluated the patient during the encounter. Patient is a 53 year old male with past medical history of diabetes, reflux. He presents today with complaints of chest pain. This started this morning shortly after drinking green tea. He describes severe pain in the center of his chest with no radiation to the arm or jaw, nausea, diaphoresis, or shortness of breath. This lasted for 2 hours then he presented here to be evaluated. His pain is he is to greatly.  On exam vitals are stable and the patient is afebrile. Head is atraumatic normocephalic. Heart is regular rate and rhythm and lungs are clear. Abdomen is benign. Extremities are without edema.  Workup reveals an unremarkable EKG despite significant discomfort. His troponin x2 is negative and ultrasound of the abdomen revealed no evidence for Cholecystitis. He reports a history of stomach ulcers for which he has not been taking his Prilosec. I doubt his symptoms are cardiac in nature given the negative cardiac workup in atypical nature of his pain. His symptoms are likely GI in nature. He will be discharged with instructions to take his stomach medications and return if his symptoms worsen or change. He will followup with his primary Dr. this week.   EKG Interpretation   Date/Time:  Sunday October 18 2013 09:21:29 EDT Ventricular Rate:  77 PR Interval:  128 QRS Duration: 110 QT Interval:  392 QTC Calculation: 443 R Axis:   25 Text Interpretation:  Normal sinus rhythm Possible Left atrial enlargement  Incomplete right bundle branch block Borderline ECG Confirmed by Beau Fanny   MD, Tadarrius Burch ([id number]) on 10/18/2013 10:12:25 AM       Veryl Speak, MD 10/18/13 1547

## 2014-11-03 ENCOUNTER — Other Ambulatory Visit: Payer: Self-pay | Admitting: Gastroenterology

## 2014-11-03 DIAGNOSIS — R1031 Right lower quadrant pain: Secondary | ICD-10-CM

## 2014-11-03 DIAGNOSIS — R634 Abnormal weight loss: Secondary | ICD-10-CM

## 2014-11-05 ENCOUNTER — Ambulatory Visit
Admission: RE | Admit: 2014-11-05 | Discharge: 2014-11-05 | Disposition: A | Discharge status: Home / Self Care | Payer: BC Managed Care – PPO | Source: Ambulatory Visit | Attending: Gastroenterology | Admitting: Gastroenterology

## 2014-11-05 DIAGNOSIS — R634 Abnormal weight loss: Secondary | ICD-10-CM

## 2014-11-05 DIAGNOSIS — R1031 Right lower quadrant pain: Secondary | ICD-10-CM

## 2016-06-01 ENCOUNTER — Ambulatory Visit: Payer: Self-pay | Admitting: Orthopedic Surgery

## 2016-06-22 ENCOUNTER — Ambulatory Visit: Payer: Self-pay | Admitting: Orthopedic Surgery

## 2016-06-22 NOTE — H&P (Signed)
Chase Sanchez is an 55 y.o. male.   Chief Complaint: R shoulder pain HPI: The patient is a 55 year old male who presents today for follow up of their shoulder. The patient is being followed for their right shoulder and neck pain. They are 9 week(s) out from when symptoms began. Symptoms reported today include: pain. Current treatment includes: activity modification, NSAIDs and muscle relaxer. The following medication has been used for pain control: Naprosyn and Flexeril. The patient presents today following MRI.  Chase Sanchez for follows up MRI of his shoulder and MRI of his cervical spine. He does have intermittent pain that goes in the third, fourth, and fifth fingers of the left hand. He does have fairly moderately severe neural foraminal stenosis. It occurs 2 to 3 times a day for about a minute it. He is probably sitting. He does overhead work and sits. His main problem is a shoulder, worse with activity, better with rest, keeps him up at night. He had a temporary relief from the injection in the shoulder.  Past Medical History:  Diagnosis Date  . Anxiety state, unspecified   . Colon polyposis   . Coronary atherosclerosis of unspecified type of vessel, native or graft   . Esophageal reflux   . Family history of diabetes mellitus   . Other and unspecified hyperlipidemia   . Other testicular hypofunction   . Personal history of other diseases of digestive system   . Tobacco use disorder   . Type II or unspecified type diabetes mellitus without mention of complication, not stated as uncontrolled   . Ventral hernia, unspecified, without mention of obstruction or gangrene     Past Surgical History:  Procedure Laterality Date  . APPENDECTOMY    . ROTATOR CUFF REPAIR     right and left  . SEPTOPLASTY    . TONSILLECTOMY      Family History  Problem Relation Age of Onset  . Diabetes Other   . Cancer Mother     lung  . Cancer Father     prostate   Social History:  reports that he has  been smoking.  He does not have any smokeless tobacco history on file. He reports that he does not drink alcohol or use drugs.  Allergies:  Allergies  Allergen Reactions  . Iohexol Shortness Of Breath     unkown  . Ivp Dye [Iodinated Diagnostic Agents] Shortness Of Breath and Nausea And Vomiting  . Codeine Hives and Itching     (Not in a hospital admission)  No results found for this or any previous visit (from the past 48 hour(s)). No results found.  Review of Systems  Constitutional: Negative.   HENT: Negative.   Respiratory: Negative.   Cardiovascular: Negative.   Gastrointestinal: Negative.   Genitourinary: Negative.   Musculoskeletal: Positive for joint pain.  Skin: Negative.   Neurological: Negative.   Psychiatric/Behavioral: Negative.     There were no vitals taken for this visit. Physical Exam  Constitutional: He is oriented to person, place, and time. He appears well-developed and well-nourished.  HENT:  Head: Normocephalic.  Eyes: Pupils are equal, round, and reactive to light.  Neck: Normal range of motion.  Cardiovascular: Normal rate.   Respiratory: Effort normal.  GI: Soft.  Musculoskeletal:  He has exquisitely positive impingement sign in the shoulder, positive secondary impingement sign, positive drop arm sign. He is tender in the anterior subacromial region. Decreased internal rotation. Nontender over the [facility]. Cervical spine, pain with  extension and lateral flexion, but motor is 5/5, normoreflexic. Sensory exam is intact today. Peripheral pulses are intact.  Neurological: He is alert and oriented to person, place, and time.  Skin: Skin is warm and dry.    MRI of his shoulder demonstrates a delaminated type tear of the rotator cuff, less than 50% of the thickness of the tendon though. His previous surgical anchor is in there. Subscap partial tearing, bicipital tendinosis. Mild arthritis of the [facility] joint.  Assessment/Plan 1. Impingement syndrome, rotator  cuff arthropathy, partial tear of the rotator cuff possible full thickness refractive to rest, activity modification, and home exercise program, temporary relief from a corticosteroid injection. 2. Axial cervical positional radicular pain at C7-C8 on the left secondary to multilevel cervical spondylosis and neural foraminal stenosis, particularly at C6-7 and C7-T1.  Plan for the cervical spine is conservative. No focal neurologic deficit. We discussed posture modification, anti-inflammatories, activity modification for shoulder. We discussed shoulder arthroscopy, debridement, subacromial decompression, possible mini open rotator cuff repair. After discussing risks and benefits with the patient, we proceeded with a shoulder injection. I sterilely prepped the posterior subacromial region with Betadine and alcohol and injected the patient with 1 mL of Aristospan and 8 mL of 1% Lidocaine solution with reduction of pain. Post-injection care was discussed with the patient. We discussed block overnight if he has an open repair outpatient if he does not. We will proceed accordingly. No MRSA or DVT. Refilled his Norco at night, Naprosyn and Flexeril. We discussed with his wife her knee issues as well.  Plan right shoulder arthroscopy, SAD, possible mini-open RCR  Chase Sanchez, Conley Rolls., PA-C for Dr. Tonita Cong 06/22/2016, 8:49 AM

## 2016-06-26 ENCOUNTER — Encounter (HOSPITAL_COMMUNITY): Payer: Self-pay

## 2016-06-27 ENCOUNTER — Encounter (HOSPITAL_COMMUNITY)
Admission: RE | Admit: 2016-06-27 | Discharge: 2016-06-27 | Disposition: A | Discharge status: Home / Self Care | Payer: BC Managed Care – PPO | Source: Ambulatory Visit | Attending: Specialist | Admitting: Specialist

## 2016-06-27 ENCOUNTER — Encounter (HOSPITAL_COMMUNITY): Payer: Self-pay

## 2016-06-27 DIAGNOSIS — Z01818 Encounter for other preprocedural examination: Secondary | ICD-10-CM | POA: Diagnosis not present

## 2016-06-27 DIAGNOSIS — E119 Type 2 diabetes mellitus without complications: Secondary | ICD-10-CM | POA: Insufficient documentation

## 2016-06-27 HISTORY — DX: Unspecified osteoarthritis, unspecified site: M19.90

## 2016-06-27 LAB — CBC
HCT: 44.2 % (ref 39.0–52.0)
HEMOGLOBIN: 15 g/dL (ref 13.0–17.0)
MCH: 29.1 pg (ref 26.0–34.0)
MCHC: 33.9 g/dL (ref 30.0–36.0)
MCV: 85.7 fL (ref 78.0–100.0)
PLATELETS: 187 10*3/uL (ref 150–400)
RBC: 5.16 MIL/uL (ref 4.22–5.81)
RDW: 13.1 % (ref 11.5–15.5)
WBC: 10.6 10*3/uL — ABNORMAL HIGH (ref 4.0–10.5)

## 2016-06-27 LAB — BASIC METABOLIC PANEL
ANION GAP: 6 (ref 5–15)
BUN: 13 mg/dL (ref 6–20)
CALCIUM: 8.9 mg/dL (ref 8.9–10.3)
CO2: 26 mmol/L (ref 22–32)
Chloride: 105 mmol/L (ref 101–111)
Creatinine, Ser: 0.59 mg/dL — ABNORMAL LOW (ref 0.61–1.24)
GFR calc Af Amer: >60 mL/min (ref >60)
GLUCOSE: 86 mg/dL (ref 65–99)
Potassium: 4.3 mmol/L (ref 3.5–5.1)
Sodium: 137 mmol/L (ref 135–145)

## 2016-06-27 LAB — ABO/RH: ABO/RH(D): O POS

## 2016-06-27 LAB — GLUCOSE, CAPILLARY: GLUCOSE-CAPILLARY: 93 mg/dL (ref 65–99)

## 2016-06-27 NOTE — Patient Instructions (Addendum)
Chase Sanchez  06/27/2016   Your procedure is scheduled on: Thursday July 05, 2016  Report to [facility]  Entrance take [place]  elevators to 3rd floor to  [place] at 8:15 AM.  Call this number if you have problems the morning of surgery [phone]   Remember: ONLY 1 PERSON MAY GO WITH YOU TO SHORT STAY TO GET  READY MORNING OF [place].  Do not eat food or drink liquids :After Midnight.     Take these medicines the morning of surgery with A SIP OF WATER: Omeprazole if needed;           How to Manage Your Diabetes Before and After Surgery  Why is it important to control my blood sugar before and after surgery? . Improving blood sugar levels before and after surgery helps healing and can limit problems. . A way of improving blood sugar control is eating a healthy diet by: o  Eating less sugar and carbohydrates o  Increasing activity/exercise o  Talking with your doctor about reaching your blood sugar goals . High blood sugars (greater than 180 mg/dL) can raise your risk of infections and slow your recovery, so you will need to focus on controlling your diabetes during the weeks before surgery. . Make sure that the doctor who takes care of your diabetes knows about your planned surgery including the date and location.  How do I manage my blood sugar before surgery? . Check your blood sugar at least 4 times a day, starting 2 days before surgery, to make sure that the level is not too high or low. o Check your blood sugar the morning of your surgery when you wake up and every 2 hours until you get to the Short Stay unit. . If your blood sugar is less than 70 mg/dL, you will need to treat for low blood sugar: o Do not take insulin. o Treat a low blood sugar (less than 70 mg/dL) with  cup of clear juice (cranberry or apple), 4 glucose tablets, OR glucose gel. o Recheck blood sugar in 15 minutes after treatment (to make sure it is greater  than 70 mg/dL). If your blood sugar is not greater than 70 mg/dL on recheck, call [phone] for further instructions. . Report your blood sugar to the short stay nurse when you get to Short Stay.  . If you are admitted to the hospital after surgery: o Your blood sugar will be checked by the staff and you will probably be given insulin after surgery (instead of oral diabetes medicines) to make sure you have good blood sugar levels. o The goal for blood sugar control after surgery is 80-180 mg/dL.   WHAT DO I DO ABOUT MY DIABETES MEDICATION?  Marland Kitchen Do not take oral diabetes medicines (pills) the morning of surgery.  Patient Signature:  Date:   Nurse Signature:  Date:   Reviewed and Endorsed by [facility] Patient Education Committee, August 2015  DO NOT TAKE ANY DIABETIC MEDICATIONS DAY OF YOUR SURGERY                               You may not have any metal on your body including hair pins and              piercings  Do not wear jewelry,  lotions,  powders or colognes, deodorant                     Men may shave face and neck.   Do not bring valuables to the hospital. [place].  Contacts, dentures or bridgework may not be worn into surgery.  Leave suitcase in the car. After surgery it may be brought to your room.                  Please read over the following fact sheets you were given:MRSA INFORMATION SHEET; INCENTIVE SPIROMETER  _____________________________________________________________________             [facility] - Preparing for Surgery Before surgery, you can play an important role.  Because skin is not sterile, your skin needs to be as free of germs as possible.  You can reduce the number of germs on your skin by washing with CHG (chlorahexidine gluconate) soap before surgery.  CHG is an antiseptic cleaner which kills germs and bonds with the skin to continue killing germs even after washing. Please DO NOT use if you  have an allergy to CHG or antibacterial soaps.  If your skin becomes reddened/irritated stop using the CHG and inform your nurse when you arrive at Short Stay. Do not shave (including legs and underarms) for at least 48 hours prior to the first CHG shower.  You may shave your face/neck. Please follow these instructions carefully:  1.  Shower with CHG Soap the night before surgery and the  morning of Surgery.  2.  If you choose to wash your hair, wash your hair first as usual with your  normal  shampoo.  3.  After you shampoo, rinse your hair and body thoroughly to remove the  shampoo.                           4.  Use CHG as you would any other liquid soap.  You can apply chg directly  to the skin and wash                       Gently with a scrungie or clean washcloth.  5.  Apply the CHG Soap to your body ONLY FROM THE NECK DOWN.   Do not use on face/ open                           Wound or open sores. Avoid contact with eyes, ears mouth and genitals (private parts).                       Wash face,  Genitals (private parts) with your normal soap.             6.  Wash thoroughly, paying special attention to the area where your surgery  will be performed.  7.  Thoroughly rinse your body with warm water from the neck down.  8.  DO NOT shower/wash with your normal soap after using and rinsing off  the CHG Soap.                9.  Pat yourself dry with a clean towel.            10.  Wear clean pajamas.  11.  Place clean sheets on your bed the night of your first shower and do not  sleep with pets. Day of Surgery : Do not apply any lotions/deodorants the morning of surgery.  Please wear clean clothes to the hospital/surgery center.  FAILURE TO FOLLOW THESE INSTRUCTIONS MAY RESULT IN THE CANCELLATION OF YOUR SURGERY PATIENT SIGNATURE_________________________________  NURSE  SIGNATURE__________________________________  ________________________________________________________________________   Chase Sanchez  An incentive spirometer is a tool that can help keep your lungs clear and active. This tool measures how well you are filling your lungs with each breath. Taking long deep breaths may help reverse or decrease the chance of developing breathing (pulmonary) problems (especially infection) following:  A long period of time when you are unable to move or be active. BEFORE THE PROCEDURE   If the spirometer includes an indicator to show your [place] effort, your nurse or respiratory therapist will set it to a desired goal.  If possible, sit up straight or lean slightly forward. Try not to slouch.  Hold the incentive spirometer in an upright position. INSTRUCTIONS FOR USE  1. Sit on the edge of your bed if possible, or sit up as far as you can in bed or on a chair. 2. Hold the incentive spirometer in an upright position. 3. Breathe out normally. 4. Place the mouthpiece in your mouth and seal your lips tightly around it. 5. Breathe in slowly and as deeply as possible, raising the piston or the ball toward the top of the column. 6. Hold your breath for 3-5 seconds or for as long as possible. Allow the piston or ball to fall to the bottom of the column. 7. Remove the mouthpiece from your mouth and breathe out normally. 8. Rest for a few seconds and repeat Steps 1 through 7 at least 10 times every 1-2 hours when you are awake. Take your time and take a few normal breaths between deep breaths. 9. The spirometer may include an indicator to show your [place] effort. Use the indicator as a goal to work toward during each repetition. 10. After each set of 10 deep breaths, practice coughing to be sure your lungs are clear. If you have an incision (the cut made at the time of surgery), support your incision when coughing by placing a pillow or rolled up towels firmly  against it. Once you are able to get out of bed, walk around indoors and cough well. You may stop using the incentive spirometer when instructed by your caregiver.  RISKS AND COMPLICATIONS  Take your time so you do not get dizzy or light-headed.  If you are in pain, you may need to take or ask for pain medication before doing incentive spirometry. It is harder to take a deep breath if you are having pain. AFTER USE  Rest and breathe slowly and easily.  It can be helpful to keep track of a log of your progress. Your caregiver can provide you with a simple table to help with this. If you are using the spirometer at home, follow these instructions: [place] IF:   You are having difficultly using the spirometer.  You have trouble using the spirometer as often as instructed.  Your pain medication is not giving enough relief while using the spirometer.  You develop fever of 100.5 F (38.1 C) or higher. SEEK IMMEDIATE MEDICAL CARE IF:   You cough up bloody sputum that had not been present before.  You develop fever of 102 F (38.9 C)  or greater.  You develop worsening pain at or near the incision site. MAKE SURE YOU:   Understand these instructions.  Will watch your condition.  Will get help right away if you are not doing well or get worse. Document Released: 12/03/2006 Document Revised: 10/15/2011 Document Reviewed: 02/03/2007 [facility] Patient Information 2014 [place], [place].   ________________________________________________________________________

## 2016-06-28 LAB — HEMOGLOBIN A1C
Hgb A1c MFr Bld: 8.7 % — ABNORMAL HIGH (ref 4.8–5.6)
Mean Plasma Glucose: 203 mg/dL

## 2016-07-02 NOTE — Progress Notes (Signed)
A1C results in epic per PAT visit 06/27/2016 sent to Dr Tonita Cong and Jennette Kettle PA

## 2016-07-05 ENCOUNTER — Encounter (HOSPITAL_COMMUNITY): Payer: Self-pay | Admitting: *Deleted

## 2016-07-05 ENCOUNTER — Encounter (HOSPITAL_COMMUNITY)
Admission: RE | Disposition: A | Discharge status: Home / Self Care | Payer: Self-pay | Source: Ambulatory Visit | Attending: Specialist

## 2016-07-05 ENCOUNTER — Ambulatory Visit (HOSPITAL_COMMUNITY): Payer: BC Managed Care – PPO | Admitting: Anesthesiology

## 2016-07-05 ENCOUNTER — Ambulatory Visit (HOSPITAL_COMMUNITY)
Admission: RE | Admit: 2016-07-05 | Discharge: 2016-07-05 | Disposition: A | Discharge status: Home / Self Care | Payer: BC Managed Care – PPO | Source: Ambulatory Visit | Attending: Specialist | Admitting: Specialist

## 2016-07-05 DIAGNOSIS — F17200 Nicotine dependence, unspecified, uncomplicated: Secondary | ICD-10-CM | POA: Insufficient documentation

## 2016-07-05 DIAGNOSIS — M4722 Other spondylosis with radiculopathy, cervical region: Secondary | ICD-10-CM | POA: Diagnosis not present

## 2016-07-05 DIAGNOSIS — M75101 Unspecified rotator cuff tear or rupture of right shoulder, not specified as traumatic: Secondary | ICD-10-CM | POA: Diagnosis not present

## 2016-07-05 DIAGNOSIS — E119 Type 2 diabetes mellitus without complications: Secondary | ICD-10-CM | POA: Diagnosis not present

## 2016-07-05 DIAGNOSIS — M25511 Pain in right shoulder: Secondary | ICD-10-CM | POA: Diagnosis present

## 2016-07-05 DIAGNOSIS — M7541 Impingement syndrome of right shoulder: Secondary | ICD-10-CM | POA: Insufficient documentation

## 2016-07-05 DIAGNOSIS — M4803 Spinal stenosis, cervicothoracic region: Secondary | ICD-10-CM | POA: Diagnosis not present

## 2016-07-05 DIAGNOSIS — I2510 Atherosclerotic heart disease of native coronary artery without angina pectoris: Secondary | ICD-10-CM | POA: Diagnosis not present

## 2016-07-05 HISTORY — PX: SHOULDER ARTHROSCOPY WITH SUBACROMIAL DECOMPRESSION AND OPEN ROTATOR C: SHX5688

## 2016-07-05 HISTORY — DX: Other specified postprocedural states: Z98.890

## 2016-07-05 HISTORY — DX: Other specified postprocedural states: R11.2

## 2016-07-05 LAB — GLUCOSE, CAPILLARY
GLUCOSE-CAPILLARY: 228 mg/dL — AB (ref 65–99)
Glucose-Capillary: 238 mg/dL — ABNORMAL HIGH (ref 65–99)
Glucose-Capillary: 236 mg/dL — ABNORMAL HIGH (ref 65–99)
Glucose-Capillary: 253 mg/dL — ABNORMAL HIGH (ref 65–99)
Glucose-Capillary: 250 mg/dL — ABNORMAL HIGH (ref 65–99)

## 2016-07-05 LAB — TYPE AND SCREEN
ABO/RH(D): O POS
Antibody Screen: NEGATIVE

## 2016-07-05 SURGERY — SHOULDER ARTHROSCOPY WITH SUBACROMIAL DECOMPRESSION AND OPEN ROTATOR CUFF REPAIR, OPEN BICEPS TENDON REPAIR
Anesthesia: Regional | Site: Shoulder | Laterality: Right

## 2016-07-05 MED ORDER — MEPERIDINE HCL 50 MG/ML IJ SOLN: 6.2500 mg | INTRAMUSCULAR | Status: DC | PRN

## 2016-07-05 MED ORDER — MIDAZOLAM HCL 2 MG/2ML IJ SOLN
INTRAMUSCULAR | Status: AC
  Filled 2016-07-05: qty 2

## 2016-07-05 MED ORDER — HYDROMORPHONE HCL 1 MG/ML IJ SOLN: 0.2500 mg | INTRAMUSCULAR | Status: DC | PRN

## 2016-07-05 MED ORDER — LABETALOL HCL 5 MG/ML IV SOLN
INTRAVENOUS | Status: DC | PRN
  Administered 2016-07-05: 5 mg via INTRAVENOUS

## 2016-07-05 MED ORDER — DEXTROSE 5 % IV SOLN
INTRAVENOUS | Status: DC | PRN
  Administered 2016-07-05: 50 ug/min via INTRAVENOUS

## 2016-07-05 MED ORDER — ROPIVACAINE HCL 7.5 MG/ML IJ SOLN
INTRAMUSCULAR | Status: DC | PRN
  Administered 2016-07-05: 20 mL via PERINEURAL

## 2016-07-05 MED ORDER — POLYETHYLENE GLYCOL 3350 17 G PO PACK: 17.0000 g | PACK | Freq: Every day | ORAL | 0 refills | Status: DC

## 2016-07-05 MED ORDER — FENTANYL CITRATE (PF) 100 MCG/2ML IJ SOLN
INTRAMUSCULAR | Status: AC
  Filled 2016-07-05: qty 2

## 2016-07-05 MED ORDER — EPINEPHRINE PF 1 MG/ML IJ SOLN
INTRAMUSCULAR | Status: DC | PRN
  Administered 2016-07-05: 2 mg

## 2016-07-05 MED ORDER — LABETALOL HCL 5 MG/ML IV SOLN
INTRAVENOUS | Status: AC
  Filled 2016-07-05: qty 12

## 2016-07-05 MED ORDER — MIDAZOLAM HCL 2 MG/2ML IJ SOLN
INTRAMUSCULAR | Status: AC
Start: 2016-07-05 — End: 2016-07-05
  Filled 2016-07-05: qty 2

## 2016-07-05 MED ORDER — DEXAMETHASONE SODIUM PHOSPHATE 10 MG/ML IJ SOLN
INTRAMUSCULAR | Status: AC
  Filled 2016-07-05: qty 1

## 2016-07-05 MED ORDER — ROCURONIUM BROMIDE 50 MG/5ML IV SOSY
PREFILLED_SYRINGE | INTRAVENOUS | Status: AC
  Filled 2016-07-05: qty 15

## 2016-07-05 MED ORDER — ALBUTEROL SULFATE (2.5 MG/3ML) 0.083% IN NEBU
2.5000 mg | INHALATION_SOLUTION | Freq: Once | RESPIRATORY_TRACT | Status: AC
Start: 2016-07-05 — End: 2016-07-05
  Administered 2016-07-05: 2.5 mg via RESPIRATORY_TRACT

## 2016-07-05 MED ORDER — PROPOFOL 10 MG/ML IV BOLUS
INTRAVENOUS | Status: DC | PRN
  Administered 2016-07-05: 200 mg via INTRAVENOUS

## 2016-07-05 MED ORDER — ONDANSETRON HCL 4 MG/2ML IJ SOLN
INTRAMUSCULAR | Status: DC | PRN
  Administered 2016-07-05: 4 mg via INTRAVENOUS

## 2016-07-05 MED ORDER — EPINEPHRINE PF 1 MG/ML IJ SOLN
INTRAMUSCULAR | Status: AC
  Filled 2016-07-05: qty 2

## 2016-07-05 MED ORDER — FENTANYL CITRATE (PF) 100 MCG/2ML IJ SOLN
50.0000 ug | INTRAMUSCULAR | Status: DC | PRN
  Administered 2016-07-05: 100 ug via INTRAVENOUS

## 2016-07-05 MED ORDER — ALBUTEROL SULFATE (2.5 MG/3ML) 0.083% IN NEBU
INHALATION_SOLUTION | RESPIRATORY_TRACT | Status: AC
  Filled 2016-07-05: qty 3

## 2016-07-05 MED ORDER — CEFAZOLIN SODIUM-DEXTROSE 2-4 GM/100ML-% IV SOLN
INTRAVENOUS | Status: AC
  Filled 2016-07-05: qty 100

## 2016-07-05 MED ORDER — SUCCINYLCHOLINE CHLORIDE 200 MG/10ML IV SOSY
PREFILLED_SYRINGE | INTRAVENOUS | Status: AC
  Filled 2016-07-05: qty 20

## 2016-07-05 MED ORDER — INSULIN ASPART 100 UNIT/ML ~~LOC~~ SOLN
3.0000 [IU] | Freq: Once | SUBCUTANEOUS | Status: AC
  Administered 2016-07-05: 3 [IU] via SUBCUTANEOUS

## 2016-07-05 MED ORDER — ROCURONIUM BROMIDE 50 MG/5ML IV SOSY
PREFILLED_SYRINGE | INTRAVENOUS | Status: AC
  Filled 2016-07-05: qty 5

## 2016-07-05 MED ORDER — OXYCODONE-ACETAMINOPHEN 5-325 MG PO TABS
1.0000 | ORAL_TABLET | Freq: Once | ORAL | Status: AC
  Administered 2016-07-05: 1 via ORAL
  Filled 2016-07-05: qty 1

## 2016-07-05 MED ORDER — ACETAMINOPHEN 10 MG/ML IV SOLN
INTRAVENOUS | Status: DC | PRN
  Administered 2016-07-05: 1000 mg via INTRAVENOUS

## 2016-07-05 MED ORDER — LACTATED RINGERS IV SOLN: INTRAVENOUS | Status: DC

## 2016-07-05 MED ORDER — PROMETHAZINE HCL 25 MG/ML IJ SOLN: 6.2500 mg | INTRAMUSCULAR | Status: DC | PRN

## 2016-07-05 MED ORDER — POLYMYXIN B SULFATE 500000 UNITS IJ SOLR
INTRAMUSCULAR | Status: AC
  Filled 2016-07-05: qty 500000

## 2016-07-05 MED ORDER — ROCURONIUM BROMIDE 100 MG/10ML IV SOLN
INTRAVENOUS | Status: DC | PRN
  Administered 2016-07-05: 20 mg via INTRAVENOUS
  Administered 2016-07-05: 50 mg via INTRAVENOUS

## 2016-07-05 MED ORDER — ACETAMINOPHEN 10 MG/ML IV SOLN
INTRAVENOUS | Status: AC
  Filled 2016-07-05: qty 100

## 2016-07-05 MED ORDER — SUGAMMADEX SODIUM 200 MG/2ML IV SOLN
INTRAVENOUS | Status: DC | PRN
  Administered 2016-07-05: 200 mg via INTRAVENOUS

## 2016-07-05 MED ORDER — SUCCINYLCHOLINE CHLORIDE 20 MG/ML IJ SOLN
INTRAMUSCULAR | Status: DC | PRN
  Administered 2016-07-05: 140 mg via INTRAVENOUS

## 2016-07-05 MED ORDER — LIDOCAINE-EPINEPHRINE 1 %-1:100000 IJ SOLN
INTRAMUSCULAR | Status: AC
Start: 2016-07-05 — End: 2016-07-05
  Filled 2016-07-05: qty 1

## 2016-07-05 MED ORDER — SCOPOLAMINE 1 MG/3DAYS TD PT72
MEDICATED_PATCH | TRANSDERMAL | Status: DC | PRN
  Administered 2016-07-05: 1 via TRANSDERMAL

## 2016-07-05 MED ORDER — PHENYLEPHRINE 40 MCG/ML (10ML) SYRINGE FOR IV PUSH (FOR BLOOD PRESSURE SUPPORT)
PREFILLED_SYRINGE | INTRAVENOUS | Status: AC
  Filled 2016-07-05: qty 10

## 2016-07-05 MED ORDER — INSULIN ASPART 100 UNIT/ML ~~LOC~~ SOLN
SUBCUTANEOUS | Status: AC
Start: 2016-07-05 — End: 2016-07-05
  Administered 2016-07-05: 3 [IU] via SUBCUTANEOUS
  Filled 2016-07-05: qty 1

## 2016-07-05 MED ORDER — CEFAZOLIN SODIUM-DEXTROSE 2-4 GM/100ML-% IV SOLN
2.0000 g | INTRAVENOUS | Status: AC
  Administered 2016-07-05: 2 g via INTRAVENOUS

## 2016-07-05 MED ORDER — SCOPOLAMINE 1 MG/3DAYS TD PT72
MEDICATED_PATCH | TRANSDERMAL | Status: AC
  Filled 2016-07-05: qty 1

## 2016-07-05 MED ORDER — DEXAMETHASONE SODIUM PHOSPHATE 10 MG/ML IJ SOLN
INTRAMUSCULAR | Status: DC | PRN
  Administered 2016-07-05: 5 mg via INTRAVENOUS

## 2016-07-05 MED ORDER — LIDOCAINE-EPINEPHRINE 1 %-1:100000 IJ SOLN
INTRAMUSCULAR | Status: DC | PRN
Start: 2016-07-05 — End: 2016-07-05
  Administered 2016-07-05: 30 mL

## 2016-07-05 MED ORDER — ALBUTEROL SULFATE HFA 108 (90 BASE) MCG/ACT IN AERS
INHALATION_SPRAY | RESPIRATORY_TRACT | Status: AC
  Filled 2016-07-05: qty 6.7

## 2016-07-05 MED ORDER — LIDOCAINE 2% (20 MG/ML) 5 ML SYRINGE
INTRAMUSCULAR | Status: AC
  Filled 2016-07-05: qty 20

## 2016-07-05 MED ORDER — KETOROLAC TROMETHAMINE 10 MG PO TABS: 10.0000 mg | ORAL_TABLET | Freq: Four times a day (QID) | ORAL | 0 refills | Status: DC | PRN

## 2016-07-05 MED ORDER — LIDOCAINE HCL (CARDIAC) 20 MG/ML IV SOLN
INTRAVENOUS | Status: DC | PRN
  Administered 2016-07-05: 50 mg via INTRAVENOUS
  Administered 2016-07-05: 100 mg via INTRATRACHEAL

## 2016-07-05 MED ORDER — ALBUTEROL SULFATE HFA 108 (90 BASE) MCG/ACT IN AERS
INHALATION_SPRAY | RESPIRATORY_TRACT | Status: DC | PRN
  Administered 2016-07-05 (×3): 3 via RESPIRATORY_TRACT

## 2016-07-05 MED ORDER — DOCUSATE SODIUM 100 MG PO CAPS
100.0000 mg | ORAL_CAPSULE | Freq: Two times a day (BID) | ORAL | 1 refills | Status: DC | PRN
Start: 2016-07-05 — End: 2017-03-20

## 2016-07-05 MED ORDER — PHENYLEPHRINE HCL 10 MG/ML IJ SOLN
INTRAMUSCULAR | Status: AC
  Filled 2016-07-05: qty 5

## 2016-07-05 MED ORDER — PROPOFOL 10 MG/ML IV BOLUS
INTRAVENOUS | Status: AC
  Filled 2016-07-05: qty 40

## 2016-07-05 MED ORDER — LACTATED RINGERS IV SOLN
INTRAVENOUS | Status: DC
  Administered 2016-07-05: 11:00:00 via INTRAVENOUS
  Administered 2016-07-05: 1000 mL via INTRAVENOUS

## 2016-07-05 MED ORDER — FENTANYL CITRATE (PF) 100 MCG/2ML IJ SOLN
INTRAMUSCULAR | Status: DC | PRN
  Administered 2016-07-05: 100 ug via INTRAVENOUS
  Administered 2016-07-05 (×2): 50 ug via INTRAVENOUS

## 2016-07-05 MED ORDER — OXYCODONE-ACETAMINOPHEN 5-325 MG PO TABS: 1.0000 | ORAL_TABLET | ORAL | 0 refills | Status: DC | PRN

## 2016-07-05 MED ORDER — PHENYLEPHRINE 40 MCG/ML (10ML) SYRINGE FOR IV PUSH (FOR BLOOD PRESSURE SUPPORT)
PREFILLED_SYRINGE | INTRAVENOUS | Status: DC | PRN
  Administered 2016-07-05 (×3): 80 ug via INTRAVENOUS

## 2016-07-05 MED ORDER — ROPIVACAINE HCL 7.5 MG/ML IJ SOLN
INTRAMUSCULAR | Status: AC
  Filled 2016-07-05: qty 20

## 2016-07-05 MED ORDER — EPHEDRINE 5 MG/ML INJ
INTRAVENOUS | Status: AC
  Filled 2016-07-05: qty 20

## 2016-07-05 MED ORDER — SUGAMMADEX SODIUM 500 MG/5ML IV SOLN
INTRAVENOUS | Status: AC
  Filled 2016-07-05: qty 5

## 2016-07-05 MED ORDER — ONDANSETRON HCL 4 MG/2ML IJ SOLN
INTRAMUSCULAR | Status: AC
  Filled 2016-07-05: qty 2

## 2016-07-05 MED ORDER — MIDAZOLAM HCL 5 MG/ML IJ SOLN
1.0000 mg | INTRAMUSCULAR | Status: DC | PRN
  Administered 2016-07-05: 2 mg via INTRAVENOUS

## 2016-07-05 MED ORDER — INSULIN ASPART 100 UNIT/ML ~~LOC~~ SOLN
SUBCUTANEOUS | Status: AC
  Filled 2016-07-05: qty 1

## 2016-07-05 SURGICAL SUPPLY — 58 items
ANCHOR NDL 9/16 CIR SZ 8 (NEEDLE) IMPLANT
ANCHOR NEEDLE 9/16 CIR SZ 8 (NEEDLE) IMPLANT
BLADE CUDA SHAVER 3.5 (BLADE) ×2 IMPLANT
BLADE OSCILLATING/SAGITTAL (BLADE) ×2
BLADE SURG SZ11 CARB STEEL (BLADE) ×2 IMPLANT
BLADE SW THK.38XMED LNG THN (BLADE) ×1 IMPLANT
BUR OVAL 4.0 (BURR) IMPLANT
CANNULA ACUFO 5X76 (CANNULA) ×2 IMPLANT
CHLORAPREP W/TINT 26ML (MISCELLANEOUS) IMPLANT
CLOTH 2% CHLOROHEXIDINE 3PK (PERSONAL CARE ITEMS) ×2 IMPLANT
DRAPE ORTHO SPLIT 77X108 STRL (DRAPES) ×2
DRAPE POUCH INSTRU U-SHP 10X18 (DRAPES) ×2 IMPLANT
DRAPE STERI 35X30 U-POUCH (DRAPES) ×2 IMPLANT
DRAPE SURG ORHT 6 SPLT 77X108 (DRAPES) IMPLANT
DRSG AQUACEL AG ADV 3.5X 4 (GAUZE/BANDAGES/DRESSINGS) ×1 IMPLANT
DRSG AQUACEL AG ADV 3.5X 6 (GAUZE/BANDAGES/DRESSINGS) ×1 IMPLANT
DRSG MEPILEX BORDER 4X8 (GAUZE/BANDAGES/DRESSINGS) IMPLANT
DRSG PAD ABDOMINAL 8X10 ST (GAUZE/BANDAGES/DRESSINGS) IMPLANT
DURAPREP 26ML APPLICATOR (WOUND CARE) ×2 IMPLANT
ELECT NDL TIP 2.8 STRL (NEEDLE) ×1 IMPLANT
ELECT NEEDLE TIP 2.8 STRL (NEEDLE) ×2 IMPLANT
ELECT REM PT RETURN 9FT ADLT (ELECTROSURGICAL) ×2
ELECTRODE REM PT RTRN 9FT ADLT (ELECTROSURGICAL) ×1 IMPLANT
GLOVE BIOGEL PI IND STRL 7.0 (GLOVE) ×1 IMPLANT
GLOVE BIOGEL PI INDICATOR 7.0 (GLOVE) ×1
GLOVE SURG SS PI 7.0 STRL IVOR (GLOVE) ×2 IMPLANT
GLOVE SURG SS PI 7.5 STRL IVOR (GLOVE) ×1 IMPLANT
GLOVE SURG SS PI 8.0 STRL IVOR (GLOVE) ×4 IMPLANT
GOWN STRL REUS W/TWL XL LVL3 (GOWN DISPOSABLE) ×4 IMPLANT
KIT BASIN OR (CUSTOM PROCEDURE TRAY) ×2 IMPLANT
KIT POSITION SHOULDER SCHLEI (MISCELLANEOUS) ×2 IMPLANT
MANIFOLD NEPTUNE II (INSTRUMENTS) ×2 IMPLANT
NDL SCORPION MULTI FIRE (NEEDLE) IMPLANT
NDL SPNL 18GX3.5 QUINCKE PK (NEEDLE) ×1 IMPLANT
NEEDLE SCORPION MULTI FIRE (NEEDLE) IMPLANT
NEEDLE SPNL 18GX3.5 QUINCKE PK (NEEDLE) ×2 IMPLANT
PACK SHOULDER (CUSTOM PROCEDURE TRAY) ×2 IMPLANT
POSITIONER SURGICAL ARM (MISCELLANEOUS) ×2 IMPLANT
SLING ARM IMMOBILIZER LRG (SOFTGOODS) ×1 IMPLANT
SLING ARM IMMOBILIZER MED (SOFTGOODS) IMPLANT
STRIP CLOSURE SKIN 1/2X4 (GAUZE/BANDAGES/DRESSINGS) ×1 IMPLANT
SUCTION FRAZIER HANDLE 10FR (MISCELLANEOUS) ×1
SUCTION TUBE FRAZIER 10FR DISP (MISCELLANEOUS) ×1 IMPLANT
SUT ETHIBOND NAB CT1 #1 30IN (SUTURE) IMPLANT
SUT ETHILON 4 0 PS 2 18 (SUTURE) ×2 IMPLANT
SUT FIBERWIRE #2 38 T-5 BLUE (SUTURE)
SUT PROLENE 3 0 PS 2 (SUTURE) IMPLANT
SUT TIGER TAPE 7 IN WHITE (SUTURE) IMPLANT
SUT VIC AB 1-0 CT2 27 (SUTURE) IMPLANT
SUT VIC AB 2-0 CT2 27 (SUTURE) IMPLANT
SUT VICRYL 0-0 OS 2 NEEDLE (SUTURE) IMPLANT
SUTURE FIBERWR #2 38 T-5 BLUE (SUTURE) IMPLANT
TAPE FIBER 2MM 7IN #2 BLUE (SUTURE) IMPLANT
TOWEL OR 17X26 10 PK STRL BLUE (TOWEL DISPOSABLE) ×2 IMPLANT
TOWEL OR NON WOVEN STRL DISP B (DISPOSABLE) ×1 IMPLANT
TUBING ARTHRO INFLOW-ONLY STRL (TUBING) ×2 IMPLANT
TUBING CONNECTING 10 (TUBING) ×2 IMPLANT
WAND HAND CNTRL MULTIVAC 90 (MISCELLANEOUS) IMPLANT

## 2016-07-05 NOTE — Progress Notes (Signed)
Dr. Lissa Hoard notifited of cbg 228.Madaline Brilliant to dc home from short stay.

## 2016-07-05 NOTE — Brief Op Note (Signed)
07/05/2016  11:14 AM  PATIENT:  Esau Grew  55 y.o. male  PRE-OPERATIVE DIAGNOSIS:  impingement syndrome and rotator cuff tear right shoulder  POST-OPERATIVE DIAGNOSIS:  impingement syndrome and rotator cuff tear right shoulder  PROCEDURE:  Procedure(s): RIGHT SHOULDER ARTHROSCOPY WITH SUBACROMIAL DECOMPRESSION (Right)  SURGEON:  Surgeon(s) and Role:    * Susa Day, MD - Primary  PHYSICIAN ASSISTANT:   ASSISTANTS: [place]   ANESTHESIA:   general  EBL:  No intake/output data recorded.  BLOOD ADMINISTERED:none  DRAINS: none   LOCAL MEDICATIONS USED:  MARCAINE     SPECIMEN:  No Specimen  DISPOSITION OF SPECIMEN:  N/A  COUNTS:  YES  TOURNIQUET:  * No tourniquets in log *  DICTATION: .Other Dictation: Dictation Number [id number]  PLAN OF CARE: Discharge to home after PACU  PATIENT DISPOSITION:  PACU - hemodynamically stable.   Delay start of Pharmacological VTE agent (>24hrs) due to surgical blood loss or risk of bleeding: no

## 2016-07-05 NOTE — Anesthesia Procedure Notes (Addendum)
Anesthesia Regional Block:  Interscalene brachial plexus block  Pre-Anesthetic Checklist: ,, timeout performed, Correct Patient, Correct Site, Correct Laterality, Correct Procedure, Correct Position, site marked, Risks and benefits discussed,  Surgical consent,  Pre-op evaluation,  At surgeon's request and post-op pain management  Laterality: Right  Prep: chloraprep       Needles:  Injection technique: Single-shot  Needle Type: Stimiplex      Needle Gauge: 21 G    Additional Needles:  Procedures: ultrasound guided (picture in chart) Interscalene brachial plexus block Narrative:  Start time: 07/05/2016 9:45 AM End time: 07/05/2016 9:55 AM  Performed by: Personally  Anesthesiologist: Nolon Nations  Additional Notes: Patient tolerated the procedure well without complications

## 2016-07-05 NOTE — Anesthesia Postprocedure Evaluation (Signed)
Anesthesia Post Note  Patient: Chase Sanchez  Procedure(s) Performed: Procedure(s) (LRB): RIGHT SHOULDER ARTHROSCOPY WITH SUBACROMIAL DECOMPRESSION (Right)  Patient location during evaluation: PACU Anesthesia Type: General and Regional Level of consciousness: sedated and patient cooperative Pain management: pain level controlled Vital Signs Assessment: post-procedure vital signs reviewed and stable Respiratory status: spontaneous breathing Cardiovascular status: stable Anesthetic complications: no    Last Vitals:  Vitals:   07/05/16 1215 07/05/16 1245  BP: (!) 95/50 (!) 95/50  Pulse: 65 65  Resp: 13 15  Temp:  36.6 C    Last Pain:  Vitals:   07/05/16 1245  TempSrc:   PainSc: 0-No pain                 Nolon Nations

## 2016-07-05 NOTE — Progress Notes (Signed)
Pt CBG noted to be 236. Dr. Lissa Hoard notified. New orders received.

## 2016-07-05 NOTE — Anesthesia Procedure Notes (Signed)
Procedure Name: Intubation Date/Time: 07/05/2016 10:18 AM Performed by: Lissa Morales Pre-anesthesia Checklist: Patient identified, Emergency Drugs available, Suction available and Patient being monitored Patient Re-evaluated:Patient Re-evaluated prior to inductionOxygen Delivery Method: Circle system utilized Preoxygenation: Pre-oxygenation with 100% oxygen Intubation Type: IV induction Ventilation: Mask ventilation without difficulty Laryngoscope Size: Mac and 4 Grade View: Grade II Tube type: Oral (LTA used) Tube size: 8.0 mm Number of attempts: 1 Airway Equipment and Method: Stylet and Oral airway Placement Confirmation: ETT inserted through vocal cords under direct vision,  positive ETCO2 and breath sounds checked- equal and bilateral Secured at: 22 cm Tube secured with: Tape Dental Injury: Teeth and Oropharynx as per pre-operative assessment

## 2016-07-05 NOTE — Discharge Instructions (Signed)
General Anesthesia, Adult, Care After These instructions provide you with information about caring for yourself after your procedure. Your health care provider may also give you more specific instructions. Your treatment has been planned according to current medical practices, but problems sometimes occur. Call your health care provider if you have any problems or questions after your procedure. What can I expect after the procedure? After the procedure, it is common to have:  Vomiting.  A sore throat.  Mental slowness. It is common to feel:  Nauseous.  Cold or shivery.  Sleepy.  Tired.  Sore or achy, even in parts of your body where you did not have surgery. Follow these instructions at home: For at least 24 hours after the procedure:  Do not:  Participate in activities where you could fall or become injured.  Drive.  Use heavy machinery.  Drink alcohol.  Take sleeping pills or medicines that cause drowsiness.  Make important decisions or sign legal documents.  Take care of children on your own.  Rest. Eating and drinking  If you vomit, drink water, juice, or soup when you can drink without vomiting.  Drink enough fluid to keep your urine clear or pale yellow.  Make sure you have little or no nausea before eating solid foods.  Follow the diet recommended by your health care provider. General instructions  Have a responsible adult stay with you until you are awake and alert.  Return to your normal activities as told by your health care provider. Ask your health care provider what activities are safe for you.  Take over-the-counter and prescription medicines only as told by your health care provider.  If you smoke, do not smoke without supervision.  Keep all follow-up visits as told by your health care provider. This is important. Contact a health care provider if:  You continue to have nausea or vomiting at home, and medicines are not helpful.  You  cannot drink fluids or start eating again.  You cannot urinate after 8-12 hours.  You develop a skin rash.  You have fever.  You have increasing redness at the site of your procedure. Get help right away if:  You have difficulty breathing.  You have chest pain.  You have unexpected bleeding.  You feel that you are having a life-threatening or urgent problem. This information is not intended to replace advice given to you by your health care provider. Make sure you discuss any questions you have with your health care provider. Document Released: 10/29/2000 Document Revised: 12/26/2015 Document Reviewed: 07/07/2015 Elsevier Interactive Patient Education  2017 [place] ARTHROSCOPY POSTOPERATIVE INSTRUCTIONS FOR DR. Tonita Cong  1.  Ice pack on shoulder 3-4 times per day.  2.  May remove bandages in 72 hours and apply band-aids to sutures.  3,  May get out of sling in AM and start gentle pendulum exercises.  You are encouraged       to move the elbow, wrist and hand.  4.  Elevate operative shoulder and elbow on pillow.  5.  Exercise your fingers to help reduce swelling.  6.  Report to your doctor should any of the following situations occur:   -Swelling of your fingers.  -Inability to wiggle your fingers.  -Coldness, turning pale or blueness of your fingers.  -Loss of sensation, numbness or tingling of your fingers.  -Unusual small or odor from under dressing.  -Excessive bleeding or drainage from the surgical site(s).  -Severe pain which is not relieved by the pain  medication your doctor prescribed                for you.  7.  Call the office to schedule and appointment for 2 weeks . 8.  Take one aspirin per day 325mg  with a meal if not on another blood thinner or allergic.  Patient Signature:  ________________________________________________________  Nurse's Signature:  ________________________________________________________

## 2016-07-05 NOTE — Addendum Note (Signed)
Addendum  created 07/05/16 1421 by Lissa Morales, CRNA   Anesthesia Event edited

## 2016-07-05 NOTE — Progress Notes (Signed)
Assisted Dr. Germeroth with right, ultrasound guided, interscalene  block. Side rails up, monitors on throughout procedure. See vital signs in flow sheet. Tolerated Procedure well.  

## 2016-07-05 NOTE — Anesthesia Preprocedure Evaluation (Signed)
Anesthesia Evaluation  Patient identified by MRN, date of birth, ID band Patient awake    Reviewed: Allergy & Precautions, NPO status , Patient's Chart, lab work & pertinent test results  Airway Mallampati: II  TM Distance: >3 FB Neck ROM: Full    Dental no notable dental hx.    Pulmonary shortness of breath, asthma , Current Smoker,    Pulmonary exam normal breath sounds clear to auscultation       Cardiovascular + CAD  Normal cardiovascular exam Rhythm:Regular Rate:Normal     Neuro/Psych negative neurological ROS  negative psych ROS   GI/Hepatic Neg liver ROS, GERD  ,  Endo/Other  diabetes, Type 2  Renal/GU negative Renal ROS     Musculoskeletal negative musculoskeletal ROS (+)   Abdominal   Peds  Hematology negative hematology ROS (+)   Anesthesia Other Findings   Reproductive/Obstetrics negative OB ROS                            Anesthesia Physical Anesthesia Plan  ASA: II  Anesthesia Plan: General and Regional   Post-op Pain Management: GA combined w/ Regional for post-op pain   Induction: Intravenous  Airway Management Planned: Oral ETT  Additional Equipment:   Intra-op Plan:   Post-operative Plan: Extubation in OR  Informed Consent: I have reviewed the patients History and Physical, chart, labs and discussed the procedure including the risks, benefits and alternatives for the proposed anesthesia with the patient or authorized representative who has indicated his/her understanding and acceptance.   Dental advisory given  Plan Discussed with: CRNA  Anesthesia Plan Comments:         Anesthesia Quick Evaluation

## 2016-07-05 NOTE — Transfer of Care (Signed)
Immediate Anesthesia Transfer of Care Note  Patient: Chase Sanchez  Procedure(s) Performed: Procedure(s): RIGHT SHOULDER ARTHROSCOPY WITH SUBACROMIAL DECOMPRESSION (Right)  Patient Location: PACU  Anesthesia Type:General  Level of Consciousness: awake, alert , oriented and patient cooperative  Airway & Oxygen Therapy: Patient Spontanous Breathing and Patient connected to face mask oxygen  Post-op Assessment: Report given to RN, Post -op Vital signs reviewed and stable and Patient moving all extremities X 4  Post vital signs: stable  Last Vitals:  Vitals:   07/05/16 0950 07/05/16 1139  BP: 120/79 123/75  Pulse: 70 67  Resp: 15 11  Temp:  36.4 C    Last Pain:  Vitals:   07/05/16 1139  TempSrc:   PainSc: 0-No pain      Patients Stated Pain Goal: 4 ([id number])  Complications: No apparent anesthesia complications

## 2016-07-05 NOTE — Progress Notes (Signed)
Dr. Lissa Hoard notified again of pt CBG of 253 15 minutes after Novolog injection. New orders received.

## 2016-07-06 ENCOUNTER — Encounter (HOSPITAL_COMMUNITY): Payer: Self-pay | Admitting: Specialist

## 2016-07-06 NOTE — Addendum Note (Signed)
Addendum  created 07/06/16 [id number] by Lissa Morales, CRNA   Charge Capture section accepted

## 2016-07-06 NOTE — Op Note (Signed)
NAMECONWELL, Chase NO.:  Sanchez  MEDICAL RECORD NO.:  [id number]  LOCATION:                                 FACILITY:  PHYSICIAN:  Susa Day, M.D.    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  PROCEDURE PERFORMED: 1. Right shoulder arthroscopy. 2. Subacromial decompression bursectomy. 3. Debridement partial tear of the rotator cuff.  ANESTHESIA:  General.  ASSISTANT:  Cleophas Dunker, PA.  HISTORY:  A 55 year old male with shoulder pain, multifactorial cervically referred, as well as impingement syndrome.  He had temporary relief from a subacromial corticosteroid injection.  Despite rest, activity modification, and exercise, he had an MRI which indicated a partial tear of the rotator cuff.  He was indicated for diagnostic shoulder arthroscopy evaluation of the rotator cuff, subacromial decompression bursectomy, and possible mini open rotator cuff repair. Risk and benefits discussed including bleeding, infection, damage to neurovascular structures, no change in symptoms, DVT, PE, anesthetic complications, etc.  TECHNIQUE:  With the patient in supine, beach-chair position, after induction of adequate anesthesia, 2 g Kefzol, he was placed in a supine beach-chair position.  The right upper extremity was prepped and draped in the usual sterile fashion.  Surgical marker was utilized to delineate the acromion and AC joint coracoid.  A standard posterolateral portal was utilized with incision through the skin only with a #11 blade.  I advanced the arthroscopic cannula into the glenohumeral joint in line with the coracoid, penetrating atraumatically.  Irrigant was utilized  to insufflate the joint at 65 mmHg.  Exam, inspection of the joint revealed some degenerative tearing of the subscapularis.  There was mild glenohumeral degenerative arthrosis.  The actual biceps tendon appeared to be intact inserting into the labrum with evidence of tearing.  There was fraying.  The posterior aspect of the rotator cuff attachment and supraspinatus appeared to be intact. There was some synovitis noted in the joint as well.  With internal and external rotation, I did not see a significant tear in the undersurface of the rotator cuff.  With degenerative fraying and tearing of the subscap that was noted as well, but the majority appeared to be intact. I localized the anterior portal between the coracoid and the anterolateral aspect of the acromion just off the [facility] joint localizing with an 18-gauge needle was placed just beneath the biceps tendon.  Made a small incision with an 11 blade anteriorly and directed the cannula into the glenohumeral joint.  There was a fair amount of scar tissue from his previous surgery and the positioning of the cannula was right over the top of the biceps tendon.  I therefore did not advance the cannula further, and felt that there was no arthroscopic requirement inside the glenohumeral joint as the labrum was intact and no significant tearing.  I therefore redirected the camera in the subacromial space and fashioned anterolateral portal through the skin with a #11  blade triangulating into the subacromial space.  Irrigant was utilized to insufflate the joint at 65 mmHg.  Inspection revealed hypertrophic bursa was noted throughout.  Performed a full bursectomy anteriorly and posteriorly.  Then shaved a small area over the anterolateral aspect of the acromion.  The supraspinatus appeared to be intact.  Posteriorly, there was a small tear in the supraspinatus.  I probed and palpated, that appeared the superficial, less than 50%.  I debrided to  good bleeding tissue with a 3.5 shaver lightly.  After performing a full bursectomy, I checked anteriorly, posteriorly, and medially.  No further pathology noted.  There was no impingement by the clavicle or any significant osseous impingement anterolaterally.  It was mainly a soft tissue and bursa.  Therefore, removed all instrumentation. Portals were closed with 4-0 nylon simple sutures, 0.25% lidocaine with epinephrine was injected in the subacromial space.  He was placed in a sling and extubated without difficulty, and transported to the recovery in satisfactory condition.  The patient tolerated the procedure well.  No complications.  Assistant, Cleophas Dunker, PA was used throughout the case for general intermittent traction of the upper extremity, monitoring the inflow and outflow of the arthroscopy fluid, and closure.  Also, preoperatively the patient had a regional block of the right upper extremity.     Susa Day, M.D.   ______________________________ Susa Day, M.D.    Chase Sanchez  D:  07/05/2016  T:  07/06/2016  Job:  [id number]

## 2016-07-16 NOTE — Addendum Note (Signed)
Addendum  created 07/16/16 1233 by Nolon Nations, MD   Anesthesia Intra Blocks edited, Sign clinical note

## 2016-08-06 HISTORY — PX: BACK SURGERY: SHX140

## 2017-03-01 ENCOUNTER — Other Ambulatory Visit: Payer: Self-pay | Admitting: Neurosurgery

## 2017-03-21 NOTE — Pre-Procedure Instructions (Signed)
Chase Sanchez  03/21/2017      [facility] - [place] - [address] [address] [place] [address] Phone: [phone] Fax: [phone]    Your procedure is scheduled on Wednesday August 22.  Report to [facility] Admitting at 5:30 A.M.  Call this number if you have problems the morning of surgery:  [phone]   Remember:  Do not eat food or drink liquids after midnight.  Take these medicines the morning of surgery with A SIP OF WATER: gabapentin (neurontin), oxycodone (percocet) if needed, tamsulosin (flomax)  7 days prior to surgery STOP taking any Aspirin, Aleve, Naproxen, Ibuprofen, Motrin, Advil, Goody's, BC's, all herbal medications, fish oil, and all vitamins  WHAT DO I DO ABOUT MY DIABETES MEDICATION?  Do not take oral diabetes medicines (pills) the morning of surgery. DO NOT TAKE glimepiride (amaryl), metformin (glucophage), repaglinide (Prandin), dapagliflozin Wilder Glade)    How to Manage Your Diabetes Before and After Surgery  Why is it important to control my blood sugar before and after surgery? . Improving blood sugar levels before and after surgery helps healing and can limit problems. . A way of improving blood sugar control is eating a healthy diet by: o  Eating less sugar and carbohydrates o  Increasing activity/exercise o  Talking with your doctor about reaching your blood sugar goals . High blood sugars (greater than 180 mg/dL) can raise your risk of infections and slow your recovery, so you will need to focus on controlling your diabetes during the weeks before surgery. . Make sure that the doctor who takes care of your diabetes knows about your planned surgery including the date and location.  How do I manage my blood sugar before surgery? . Check your blood sugar at least 4 times a day, starting 2 days before surgery, to make sure that the level is not too high or low. o Check  your blood sugar the morning of your surgery when you wake up and every 2 hours until you get to the Short Stay unit. . If your blood sugar is less than 70 mg/dL, you will need to treat for low blood sugar: o Do not take insulin. o Treat a low blood sugar (less than 70 mg/dL) with  cup of clear juice (cranberry or apple), 4 glucose tablets, OR glucose gel. o Recheck blood sugar in 15 minutes after treatment (to make sure it is greater than 70 mg/dL). If your blood sugar is not greater than 70 mg/dL on recheck, call [phone] for further instructions. . Report your blood sugar to the short stay nurse when you get to Short Stay.  . If you are admitted to the hospital after surgery: o Your blood sugar will be checked by the staff and you will probably be given insulin after surgery (instead of oral diabetes medicines) to make sure you have good blood sugar levels. o The goal for blood sugar control after surgery is 80-180 mg/dL.                Do not wear jewelry, make-up or nail polish.  Do not wear lotions, powders, or perfumes, or deoderant.  Do not shave 48 hours prior to surgery.  Men may shave face and neck.  Do not bring valuables to the hospital.  [facility] is not responsible for any belongings or valuables.  Contacts, dentures or bridgework may not be worn into surgery.  Leave your  suitcase in the car.  After surgery it may be brought to your room.  For patients admitted to the hospital, discharge time will be determined by your treatment team.  Patients discharged the day of surgery will not be allowed to drive home.    Special instructions:    [facility]- Preparing For Surgery  Before surgery, you can play an important role. Because skin is not sterile, your skin needs to be as free of germs as possible. You can reduce the number of germs on your skin by washing with CHG (chlorahexidine gluconate) Soap before surgery.  CHG is an antiseptic cleaner which kills  germs and bonds with the skin to continue killing germs even after washing.  Please do not use if you have an allergy to CHG or antibacterial soaps. If your skin becomes reddened/irritated stop using the CHG.  Do not shave (including legs and underarms) for at least 48 hours prior to first CHG shower. It is OK to shave your face.  Please follow these instructions carefully.   1. Shower the NIGHT BEFORE SURGERY and the MORNING OF SURGERY with CHG.   2. If you chose to wash your hair, wash your hair first as usual with your normal shampoo.  3. After you shampoo, rinse your hair and body thoroughly to remove the shampoo.  4. Use CHG as you would any other liquid soap. You can apply CHG directly to the skin and wash gently with a scrungie or a clean washcloth.   5. Apply the CHG Soap to your body ONLY FROM THE NECK DOWN.  Do not use on open wounds or open sores. Avoid contact with your eyes, ears, mouth and genitals (private parts). Wash genitals (private parts) with your normal soap.  6. Wash thoroughly, paying special attention to the area where your surgery will be performed.  7. Thoroughly rinse your body with warm water from the neck down.  8. DO NOT shower/wash with your normal soap after using and rinsing off the CHG Soap.  9. Pat yourself dry with a CLEAN TOWEL.   10. Wear CLEAN PAJAMAS   11. Place CLEAN SHEETS on your bed the night of your first shower and DO NOT SLEEP WITH PETS.    Day of Surgery: Do not apply any deodorants/lotions. Please wear clean clothes to the hospital/surgery center.      Please read over the following fact sheets that you were given. MRSA Information

## 2017-03-22 ENCOUNTER — Encounter (HOSPITAL_COMMUNITY): Payer: Self-pay

## 2017-03-22 ENCOUNTER — Encounter (HOSPITAL_COMMUNITY)
Admission: RE | Admit: 2017-03-22 | Discharge: 2017-03-22 | Disposition: A | Discharge status: Home / Self Care | Payer: BC Managed Care – PPO | Source: Ambulatory Visit | Attending: Neurosurgery | Admitting: Neurosurgery

## 2017-03-22 DIAGNOSIS — E785 Hyperlipidemia, unspecified: Secondary | ICD-10-CM | POA: Insufficient documentation

## 2017-03-22 DIAGNOSIS — E119 Type 2 diabetes mellitus without complications: Secondary | ICD-10-CM | POA: Diagnosis not present

## 2017-03-22 DIAGNOSIS — I2510 Atherosclerotic heart disease of native coronary artery without angina pectoris: Secondary | ICD-10-CM | POA: Diagnosis not present

## 2017-03-22 DIAGNOSIS — K219 Gastro-esophageal reflux disease without esophagitis: Secondary | ICD-10-CM | POA: Insufficient documentation

## 2017-03-22 DIAGNOSIS — M50223 Other cervical disc displacement at C6-C7 level: Secondary | ICD-10-CM | POA: Diagnosis not present

## 2017-03-22 DIAGNOSIS — E291 Testicular hypofunction: Secondary | ICD-10-CM | POA: Diagnosis not present

## 2017-03-22 DIAGNOSIS — Z01812 Encounter for preprocedural laboratory examination: Secondary | ICD-10-CM | POA: Insufficient documentation

## 2017-03-22 DIAGNOSIS — K439 Ventral hernia without obstruction or gangrene: Secondary | ICD-10-CM | POA: Insufficient documentation

## 2017-03-22 DIAGNOSIS — F411 Generalized anxiety disorder: Secondary | ICD-10-CM | POA: Insufficient documentation

## 2017-03-22 DIAGNOSIS — Z8719 Personal history of other diseases of the digestive system: Secondary | ICD-10-CM | POA: Insufficient documentation

## 2017-03-22 DIAGNOSIS — E669 Obesity, unspecified: Secondary | ICD-10-CM | POA: Insufficient documentation

## 2017-03-22 LAB — BASIC METABOLIC PANEL
Anion gap: 9 (ref 5–15)
BUN: 11 mg/dL (ref 6–20)
CALCIUM: 8.7 mg/dL — AB (ref 8.9–10.3)
CO2: 22 mmol/L (ref 22–32)
CREATININE: 0.95 mg/dL (ref 0.61–1.24)
Chloride: 101 mmol/L (ref 101–111)
GFR calc non Af Amer: >60 mL/min (ref >60)
GLUCOSE: 379 mg/dL — AB (ref 65–99)
Potassium: 4.2 mmol/L (ref 3.5–5.1)
Sodium: 132 mmol/L — ABNORMAL LOW (ref 135–145)

## 2017-03-22 LAB — GLUCOSE, CAPILLARY: Glucose-Capillary: 354 mg/dL — ABNORMAL HIGH (ref 65–99)

## 2017-03-22 LAB — CBC
HCT: 47.4 % (ref 39.0–52.0)
Hemoglobin: 16 g/dL (ref 13.0–17.0)
MCH: 28.8 pg (ref 26.0–34.0)
MCHC: 33.8 g/dL (ref 30.0–36.0)
MCV: 85.4 fL (ref 78.0–100.0)
PLATELETS: 184 10*3/uL (ref 150–400)
RBC: 5.55 MIL/uL (ref 4.22–5.81)
RDW: 13.2 % (ref 11.5–15.5)
WBC: 9.8 10*3/uL (ref 4.0–10.5)

## 2017-03-22 LAB — TYPE AND SCREEN
ABO/RH(D): O POS
Antibody Screen: NEGATIVE

## 2017-03-22 LAB — SURGICAL PCR SCREEN
MRSA, PCR: NEGATIVE
Staphylococcus aureus: POSITIVE — AB

## 2017-03-22 LAB — ABO/RH: ABO/RH(D): O POS

## 2017-03-22 MED ORDER — CHLORHEXIDINE GLUCONATE CLOTH 2 % EX PADS: 6.0000 | MEDICATED_PAD | Freq: Once | CUTANEOUS | Status: DC

## 2017-03-22 NOTE — Progress Notes (Signed)
PCP - [name] Cardiologist - none, saw Dr. Johnsie Cancel in 2012 for stress test, previously had normal cath "years before 2012"  EKG - 06/27/16 Stress Test - 04/19/11  CBG normally 180-220 at home per patient, A1c was recently checked and was "high" patients states he was started on a new diabetic medicine recently to help control this.  CBG today 354, anesthesia notified. Pt educated to keep close eye on blood sugar the next few days and to notify PCP if continues to be over 200. Pt encouraged to maintain diet to help control blood sugars over the next few days as well. Pt verbalized understanding.   Patient denies shortness of breath, fever, cough and chest pain at PAT appointment  Patient verbalized understanding of instructions that were given to them at the PAT appointment. Patient was also instructed that they will need to review over the PAT instructions again at home before surgery.

## 2017-03-22 NOTE — Progress Notes (Signed)
Nasal PCR positive for MSSA, negative for MRSA. Message left for patient and prescription called to pharmacy

## 2017-03-25 NOTE — Progress Notes (Addendum)
Anesthesia Chart Review:  Pt is a 56 year old male scheduled for C5-6, C6-7 ACDF on 03/27/2017 with Jovita Gamma, MD  - PCP is L. [name], MD  PMH includes:  CAD, heart murmur ("since birth"; saw cardiologist Jenkins Rouge, MD in 2012 for CP, DOE; his note documents "no murmur"), DM, hyperlipidemia, asthma, post-op N/V, GERD. Current smoker. BMI 30.5. S/p R shoulder arthroscopy 07/05/16. 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   - Glucose 379.  - Pt reports fasting glucose usually 180-220 - HbA1c was 9.2 on 02/27/17 at PCP's office.  Repaglinide was added to medications at that time.  - I left voicemail for [facility] in Dr. Donnella Bi office about uncontrolled DM.   EKG 06/27/16: NSR. Possible LA enlargement. Incomplete RBBB.   Nuclear stress test 04/20/11: Normal myovue study with no evidence of ischemia or infarction  Cardiac cath 12/14/99:  1. LM normal 2. LAD: 25% stenosis in the mid vessel.  It gives rise to a large first diagonal and a small second diagonal. 3. LCX: dominant vessel.  It gives rise to a small OM-1 and a large OM-2.  OM-2 has a 25% stenosis at its origin.  The LCX also gives rise to a normal sized posterolateral branch and a normal sized posterior descending artery.  In the distal LCX at the origin of the posterior descending artery is a 25% stenosis. 4. RCA:  The right coronary artery is a small nondominant vessel. It is free of angiographic disease.  If blood glucose acceptable DOS, I anticipate pt can proceed as scheduled.   Willeen Cass, FNP-BC [facility]/Anesthesiology Phone: [phone] 03/25/2017 11:39 AM

## 2017-03-26 MED ORDER — CEFAZOLIN SODIUM-DEXTROSE 2-4 GM/100ML-% IV SOLN
2.0000 g | INTRAVENOUS | Status: AC
  Administered 2017-03-27: 2 g via INTRAVENOUS
  Filled 2017-03-26: qty 100

## 2017-03-26 NOTE — Anesthesia Preprocedure Evaluation (Addendum)
Anesthesia Evaluation  Patient identified by MRN, date of birth, ID band Patient awake    Reviewed: Allergy & Precautions, NPO status , Patient's Chart, lab work & pertinent test results  History of Anesthesia Complications (+) PONV and history of anesthetic complications  Airway Mallampati: II  TM Distance: >3 FB Neck ROM: Full    Dental  (+) Poor Dentition, Dental Advisory Given   Pulmonary shortness of breath, asthma , Current Smoker,    Pulmonary exam normal        Cardiovascular + CAD  Normal cardiovascular exam     Neuro/Psych Anxiety negative neurological ROS     GI/Hepatic Neg liver ROS, GERD  ,  Endo/Other  diabetes, Type 2, Oral Hypoglycemic Agents  Renal/GU negative Renal ROS     Musculoskeletal negative musculoskeletal ROS (+) Arthritis , Osteoarthritis,    Abdominal   Peds  Hematology negative hematology ROS (+)   Anesthesia Other Findings   Reproductive/Obstetrics negative OB ROS                           Anesthesia Physical  Anesthesia Plan  ASA: II  Anesthesia Plan: General   Post-op Pain Management: GA combined w/ Regional for post-op pain   Induction: Intravenous  PONV Risk Score and Plan: 3 and Ondansetron, Dexamethasone and Scopolamine patch - Pre-op  Airway Management Planned: Oral ETT  Additional Equipment:   Intra-op Plan:   Post-operative Plan: Extubation in OR  Informed Consent: I have reviewed the patients History and Physical, chart, labs and discussed the procedure including the risks, benefits and alternatives for the proposed anesthesia with the patient or authorized representative who has indicated his/her understanding and acceptance.   Dental advisory given  Plan Discussed with: CRNA, Anesthesiologist and Surgeon  Anesthesia Plan Comments:        Anesthesia Quick Evaluation

## 2017-03-27 ENCOUNTER — Observation Stay (HOSPITAL_COMMUNITY)
Admission: RE | Admit: 2017-03-27 | Discharge: 2017-03-28 | Disposition: A | Discharge status: Home / Self Care | Payer: BC Managed Care – PPO | Source: Ambulatory Visit | Attending: Neurosurgery | Admitting: Neurosurgery

## 2017-03-27 ENCOUNTER — Encounter (HOSPITAL_COMMUNITY): Payer: Self-pay | Admitting: *Deleted

## 2017-03-27 ENCOUNTER — Ambulatory Visit (HOSPITAL_COMMUNITY): Payer: BC Managed Care – PPO | Admitting: Anesthesiology

## 2017-03-27 ENCOUNTER — Ambulatory Visit (HOSPITAL_COMMUNITY): Payer: BC Managed Care – PPO | Admitting: Vascular Surgery

## 2017-03-27 ENCOUNTER — Encounter (HOSPITAL_COMMUNITY)
Admission: RE | Disposition: A | Discharge status: Home / Self Care | Payer: Self-pay | Source: Ambulatory Visit | Attending: Neurosurgery

## 2017-03-27 ENCOUNTER — Ambulatory Visit (HOSPITAL_COMMUNITY): Payer: BC Managed Care – PPO

## 2017-03-27 DIAGNOSIS — K219 Gastro-esophageal reflux disease without esophagitis: Secondary | ICD-10-CM | POA: Diagnosis not present

## 2017-03-27 DIAGNOSIS — E669 Obesity, unspecified: Secondary | ICD-10-CM | POA: Diagnosis not present

## 2017-03-27 DIAGNOSIS — M50123 Cervical disc disorder at C6-C7 level with radiculopathy: Secondary | ICD-10-CM | POA: Diagnosis not present

## 2017-03-27 DIAGNOSIS — M4722 Other spondylosis with radiculopathy, cervical region: Secondary | ICD-10-CM | POA: Diagnosis not present

## 2017-03-27 DIAGNOSIS — J45909 Unspecified asthma, uncomplicated: Secondary | ICD-10-CM | POA: Insufficient documentation

## 2017-03-27 DIAGNOSIS — I2510 Atherosclerotic heart disease of native coronary artery without angina pectoris: Secondary | ICD-10-CM | POA: Insufficient documentation

## 2017-03-27 DIAGNOSIS — Z79899 Other long term (current) drug therapy: Secondary | ICD-10-CM | POA: Insufficient documentation

## 2017-03-27 DIAGNOSIS — E119 Type 2 diabetes mellitus without complications: Secondary | ICD-10-CM | POA: Insufficient documentation

## 2017-03-27 DIAGNOSIS — F419 Anxiety disorder, unspecified: Secondary | ICD-10-CM | POA: Insufficient documentation

## 2017-03-27 DIAGNOSIS — F17210 Nicotine dependence, cigarettes, uncomplicated: Secondary | ICD-10-CM | POA: Insufficient documentation

## 2017-03-27 DIAGNOSIS — Z6830 Body mass index (BMI) 30.0-30.9, adult: Secondary | ICD-10-CM | POA: Insufficient documentation

## 2017-03-27 DIAGNOSIS — Z7984 Long term (current) use of oral hypoglycemic drugs: Secondary | ICD-10-CM | POA: Diagnosis not present

## 2017-03-27 DIAGNOSIS — M5020 Other cervical disc displacement, unspecified cervical region: Secondary | ICD-10-CM | POA: Diagnosis present

## 2017-03-27 DIAGNOSIS — Z419 Encounter for procedure for purposes other than remedying health state, unspecified: Secondary | ICD-10-CM

## 2017-03-27 DIAGNOSIS — E785 Hyperlipidemia, unspecified: Secondary | ICD-10-CM | POA: Insufficient documentation

## 2017-03-27 HISTORY — PX: ANTERIOR CERVICAL DECOMP/DISCECTOMY FUSION: SHX1161

## 2017-03-27 LAB — GLUCOSE, CAPILLARY
Glucose-Capillary: 137 mg/dL — ABNORMAL HIGH (ref 65–99)
Glucose-Capillary: 211 mg/dL — ABNORMAL HIGH (ref 65–99)
Glucose-Capillary: 336 mg/dL — ABNORMAL HIGH (ref 65–99)
Glucose-Capillary: 268 mg/dL — ABNORMAL HIGH (ref 65–99)

## 2017-03-27 SURGERY — ANTERIOR CERVICAL DECOMPRESSION/DISCECTOMY FUSION 2 LEVELS
Anesthesia: General | Site: Neck

## 2017-03-27 MED ORDER — BUPIVACAINE HCL (PF) 0.5 % IJ SOLN
INTRAMUSCULAR | Status: AC
  Filled 2017-03-27: qty 30

## 2017-03-27 MED ORDER — GLIMEPIRIDE 4 MG PO TABS
4.0000 mg | ORAL_TABLET | Freq: Every day | ORAL | Status: DC
  Administered 2017-03-28: 4 mg via ORAL
  Filled 2017-03-27: qty 2
  Filled 2017-03-27: qty 1

## 2017-03-27 MED ORDER — MIDAZOLAM HCL 5 MG/5ML IJ SOLN
INTRAMUSCULAR | Status: DC | PRN
  Administered 2017-03-27: 2 mg via INTRAVENOUS

## 2017-03-27 MED ORDER — KETOROLAC TROMETHAMINE 30 MG/ML IJ SOLN
INTRAMUSCULAR | Status: AC
  Filled 2017-03-27: qty 1

## 2017-03-27 MED ORDER — FENTANYL CITRATE (PF) 250 MCG/5ML IJ SOLN
INTRAMUSCULAR | Status: AC
  Filled 2017-03-27: qty 5

## 2017-03-27 MED ORDER — THROMBIN 20000 UNITS EX SOLR
CUTANEOUS | Status: AC
  Filled 2017-03-27: qty 20000

## 2017-03-27 MED ORDER — MIDAZOLAM HCL 2 MG/2ML IJ SOLN
INTRAMUSCULAR | Status: AC
  Filled 2017-03-27: qty 2

## 2017-03-27 MED ORDER — POTASSIUM CHLORIDE IN NACL 20-0.9 MEQ/L-% IV SOLN: INTRAVENOUS | Status: DC

## 2017-03-27 MED ORDER — MORPHINE SULFATE (PF) 4 MG/ML IV SOLN: 4.0000 mg | INTRAVENOUS | Status: DC | PRN

## 2017-03-27 MED ORDER — PROMETHAZINE HCL 25 MG/ML IJ SOLN: 6.2500 mg | INTRAMUSCULAR | Status: DC | PRN

## 2017-03-27 MED ORDER — FLEET ENEMA 7-19 GM/118ML RE ENEM: 1.0000 | ENEMA | Freq: Once | RECTAL | Status: DC | PRN

## 2017-03-27 MED ORDER — ALBUMIN HUMAN 5 % IV SOLN
INTRAVENOUS | Status: DC | PRN
  Administered 2017-03-27: 10:00:00 via INTRAVENOUS

## 2017-03-27 MED ORDER — THROMBIN 5000 UNITS EX SOLR
OROMUCOSAL | Status: DC | PRN
  Administered 2017-03-27: 09:00:00 via TOPICAL

## 2017-03-27 MED ORDER — DEXAMETHASONE SODIUM PHOSPHATE 10 MG/ML IJ SOLN
INTRAMUSCULAR | Status: AC
  Filled 2017-03-27: qty 1

## 2017-03-27 MED ORDER — BACITRACIN 50000 UNITS IM SOLR
INTRAMUSCULAR | Status: DC | PRN
  Administered 2017-03-27: 09:00:00

## 2017-03-27 MED ORDER — ACETAMINOPHEN 325 MG PO TABS
650.0000 mg | ORAL_TABLET | ORAL | Status: DC | PRN
Start: 2017-03-27 — End: 2017-03-28

## 2017-03-27 MED ORDER — ONDANSETRON HCL 4 MG/2ML IJ SOLN
INTRAMUSCULAR | Status: DC | PRN
  Administered 2017-03-27: 4 mg via INTRAVENOUS

## 2017-03-27 MED ORDER — LIDOCAINE HCL (CARDIAC) 20 MG/ML IV SOLN
INTRAVENOUS | Status: DC | PRN
  Administered 2017-03-27: 100 mg via INTRAVENOUS

## 2017-03-27 MED ORDER — TAMSULOSIN HCL 0.4 MG PO CAPS
0.4000 mg | ORAL_CAPSULE | Freq: Two times a day (BID) | ORAL | Status: DC
  Administered 2017-03-27 – 2017-03-28 (×3): 0.4 mg via ORAL
  Filled 2017-03-27 (×3): qty 1

## 2017-03-27 MED ORDER — EPHEDRINE SULFATE 50 MG/ML IJ SOLN
INTRAMUSCULAR | Status: DC | PRN
  Administered 2017-03-27: 10 mg via INTRAVENOUS
  Administered 2017-03-27: 15 mg via INTRAVENOUS

## 2017-03-27 MED ORDER — HYDROCODONE-ACETAMINOPHEN 5-325 MG PO TABS
1.0000 | ORAL_TABLET | ORAL | Status: DC | PRN
  Administered 2017-03-27 – 2017-03-28 (×6): 2 via ORAL
  Filled 2017-03-27 (×6): qty 2

## 2017-03-27 MED ORDER — LIDOCAINE 2% (20 MG/ML) 5 ML SYRINGE
INTRAMUSCULAR | Status: AC
  Filled 2017-03-27: qty 5

## 2017-03-27 MED ORDER — HYDROXYZINE HCL 25 MG PO TABS: 50.0000 mg | ORAL_TABLET | ORAL | Status: DC | PRN

## 2017-03-27 MED ORDER — ONDANSETRON HCL 4 MG PO TABS: 4.0000 mg | ORAL_TABLET | Freq: Four times a day (QID) | ORAL | Status: DC | PRN

## 2017-03-27 MED ORDER — PROPOFOL 10 MG/ML IV BOLUS
INTRAVENOUS | Status: DC | PRN
Start: 2017-03-27 — End: 2017-03-27
  Administered 2017-03-27: 200 mg via INTRAVENOUS

## 2017-03-27 MED ORDER — HYDROMORPHONE HCL 1 MG/ML IJ SOLN
INTRAMUSCULAR | Status: AC
  Filled 2017-03-27: qty 1

## 2017-03-27 MED ORDER — 0.9 % SODIUM CHLORIDE (POUR BTL) OPTIME
TOPICAL | Status: DC | PRN
  Administered 2017-03-27: 1000 mL

## 2017-03-27 MED ORDER — KETOROLAC TROMETHAMINE 30 MG/ML IJ SOLN
30.0000 mg | Freq: Once | INTRAMUSCULAR | Status: AC
  Administered 2017-03-27: 30 mg via INTRAVENOUS

## 2017-03-27 MED ORDER — FENTANYL CITRATE (PF) 100 MCG/2ML IJ SOLN
INTRAMUSCULAR | Status: DC | PRN
  Administered 2017-03-27: 100 ug via INTRAVENOUS
  Administered 2017-03-27 (×2): 50 ug via INTRAVENOUS
  Administered 2017-03-27: 100 ug via INTRAVENOUS

## 2017-03-27 MED ORDER — ROCURONIUM BROMIDE 10 MG/ML (PF) SYRINGE
PREFILLED_SYRINGE | INTRAVENOUS | Status: AC
  Filled 2017-03-27: qty 5

## 2017-03-27 MED ORDER — BISACODYL 10 MG RE SUPP: 10.0000 mg | Freq: Every day | RECTAL | Status: DC | PRN

## 2017-03-27 MED ORDER — ONDANSETRON HCL 4 MG/2ML IJ SOLN
INTRAMUSCULAR | Status: AC
  Filled 2017-03-27: qty 2

## 2017-03-27 MED ORDER — HYDROMORPHONE HCL 1 MG/ML IJ SOLN
0.2500 mg | INTRAMUSCULAR | Status: DC | PRN
  Administered 2017-03-27 (×4): 0.5 mg via INTRAVENOUS

## 2017-03-27 MED ORDER — CYCLOBENZAPRINE HCL 5 MG PO TABS
5.0000 mg | ORAL_TABLET | Freq: Three times a day (TID) | ORAL | Status: DC | PRN
  Administered 2017-03-27 – 2017-03-28 (×2): 5 mg via ORAL
  Filled 2017-03-27 (×2): qty 1
  Filled 2017-03-27: qty 2

## 2017-03-27 MED ORDER — BUPIVACAINE HCL (PF) 0.5 % IJ SOLN
INTRAMUSCULAR | Status: DC | PRN
Start: 2017-03-27 — End: 2017-03-27
  Administered 2017-03-27: 5 mL

## 2017-03-27 MED ORDER — LACTATED RINGERS IV SOLN
INTRAVENOUS | Status: DC | PRN
  Administered 2017-03-27 (×2): via INTRAVENOUS

## 2017-03-27 MED ORDER — ROCURONIUM BROMIDE 100 MG/10ML IV SOLN
INTRAVENOUS | Status: DC | PRN
  Administered 2017-03-27: 20 mg via INTRAVENOUS
  Administered 2017-03-27: 10 mg via INTRAVENOUS
  Administered 2017-03-27: 70 mg via INTRAVENOUS
  Administered 2017-03-27: 10 mg via INTRAVENOUS

## 2017-03-27 MED ORDER — ALUM & MAG HYDROXIDE-SIMETH 200-200-20 MG/5ML PO SUSP
30.0000 mL | Freq: Four times a day (QID) | ORAL | Status: DC | PRN
  Administered 2017-03-27: 30 mL via ORAL
  Filled 2017-03-27: qty 30

## 2017-03-27 MED ORDER — PROPOFOL 10 MG/ML IV BOLUS
INTRAVENOUS | Status: AC
  Filled 2017-03-27: qty 20

## 2017-03-27 MED ORDER — REPAGLINIDE 0.5 MG PO TABS
0.5000 mg | ORAL_TABLET | Freq: Three times a day (TID) | ORAL | Status: DC
  Administered 2017-03-27 – 2017-03-28 (×3): 0.5 mg via ORAL
  Filled 2017-03-27 (×4): qty 1

## 2017-03-27 MED ORDER — PHENOL 1.4 % MT LIQD: 1.0000 | OROMUCOSAL | Status: DC | PRN

## 2017-03-27 MED ORDER — ACETAMINOPHEN 10 MG/ML IV SOLN
INTRAVENOUS | Status: AC
  Filled 2017-03-27: qty 100

## 2017-03-27 MED ORDER — HYDROXYZINE HCL 50 MG/ML IM SOLN: 50.0000 mg | INTRAMUSCULAR | Status: DC | PRN

## 2017-03-27 MED ORDER — METFORMIN HCL 500 MG PO TABS
1000.0000 mg | ORAL_TABLET | Freq: Two times a day (BID) | ORAL | Status: DC
  Administered 2017-03-27 – 2017-03-28 (×2): 1000 mg via ORAL
  Filled 2017-03-27 (×2): qty 2

## 2017-03-27 MED ORDER — KETOROLAC TROMETHAMINE 30 MG/ML IJ SOLN
30.0000 mg | Freq: Four times a day (QID) | INTRAMUSCULAR | Status: DC
  Administered 2017-03-27 – 2017-03-28 (×3): 30 mg via INTRAVENOUS
  Filled 2017-03-27 (×3): qty 1

## 2017-03-27 MED ORDER — SCOPOLAMINE 1 MG/3DAYS TD PT72
1.0000 | MEDICATED_PATCH | TRANSDERMAL | Status: DC
  Administered 2017-03-27: 1.5 mg via TRANSDERMAL
  Filled 2017-03-27: qty 1

## 2017-03-27 MED ORDER — DEXAMETHASONE SODIUM PHOSPHATE 10 MG/ML IJ SOLN
INTRAMUSCULAR | Status: DC | PRN
  Administered 2017-03-27: 4 mg via INTRAVENOUS

## 2017-03-27 MED ORDER — THROMBIN 20000 UNITS EX SOLR
CUTANEOUS | Status: DC | PRN
  Administered 2017-03-27: 09:00:00 via TOPICAL

## 2017-03-27 MED ORDER — MAGNESIUM HYDROXIDE 400 MG/5ML PO SUSP
30.0000 mL | Freq: Every day | ORAL | Status: DC | PRN
Start: 2017-03-27 — End: 2017-03-28

## 2017-03-27 MED ORDER — LIDOCAINE-EPINEPHRINE 1 %-1:100000 IJ SOLN
INTRAMUSCULAR | Status: AC
  Filled 2017-03-27: qty 1

## 2017-03-27 MED ORDER — SODIUM CHLORIDE 0.9% FLUSH: 3.0000 mL | INTRAVENOUS | Status: DC | PRN

## 2017-03-27 MED ORDER — PHENYLEPHRINE HCL 10 MG/ML IJ SOLN
INTRAVENOUS | Status: DC | PRN
  Administered 2017-03-27: 20 ug/min via INTRAVENOUS

## 2017-03-27 MED ORDER — SUCCINYLCHOLINE CHLORIDE 200 MG/10ML IV SOSY
PREFILLED_SYRINGE | INTRAVENOUS | Status: AC
  Filled 2017-03-27: qty 10

## 2017-03-27 MED ORDER — INSULIN ASPART 100 UNIT/ML ~~LOC~~ SOLN
0.0000 [IU] | Freq: Three times a day (TID) | SUBCUTANEOUS | Status: DC
  Administered 2017-03-27: 11 [IU] via SUBCUTANEOUS
  Administered 2017-03-28 (×2): 5 [IU] via SUBCUTANEOUS

## 2017-03-27 MED ORDER — THROMBIN 5000 UNITS EX SOLR
CUTANEOUS | Status: AC
  Filled 2017-03-27: qty 5000

## 2017-03-27 MED ORDER — LORAZEPAM 0.5 MG PO TABS
0.5000 mg | ORAL_TABLET | Freq: Every evening | ORAL | Status: DC | PRN
  Administered 2017-03-27: 1 mg via ORAL
  Filled 2017-03-27: qty 2

## 2017-03-27 MED ORDER — ACETAMINOPHEN 10 MG/ML IV SOLN
INTRAVENOUS | Status: DC | PRN
  Administered 2017-03-27: 1000 mg via INTRAVENOUS

## 2017-03-27 MED ORDER — ACETAMINOPHEN 650 MG RE SUPP
650.0000 mg | RECTAL | Status: DC | PRN
Start: 2017-03-27 — End: 2017-03-28

## 2017-03-27 MED ORDER — MENTHOL 3 MG MT LOZG: 1.0000 | LOZENGE | OROMUCOSAL | Status: DC | PRN

## 2017-03-27 MED ORDER — INSULIN ASPART 100 UNIT/ML ~~LOC~~ SOLN
0.0000 [IU] | Freq: Every day | SUBCUTANEOUS | Status: DC
  Administered 2017-03-27: 3 [IU] via SUBCUTANEOUS

## 2017-03-27 MED ORDER — GABAPENTIN 300 MG PO CAPS
300.0000 mg | ORAL_CAPSULE | Freq: Two times a day (BID) | ORAL | Status: DC
  Administered 2017-03-27 – 2017-03-28 (×2): 300 mg via ORAL
  Filled 2017-03-27 (×2): qty 1

## 2017-03-27 MED ORDER — SODIUM CHLORIDE 0.9% FLUSH
3.0000 mL | Freq: Two times a day (BID) | INTRAVENOUS | Status: DC
  Administered 2017-03-27 (×2): 3 mL via INTRAVENOUS

## 2017-03-27 MED ORDER — SUGAMMADEX SODIUM 200 MG/2ML IV SOLN
INTRAVENOUS | Status: DC | PRN
  Administered 2017-03-27: 182.2 mg via INTRAVENOUS

## 2017-03-27 MED ORDER — ONDANSETRON HCL 4 MG/2ML IJ SOLN: 4.0000 mg | Freq: Four times a day (QID) | INTRAMUSCULAR | Status: DC | PRN

## 2017-03-27 MED ORDER — LIDOCAINE-EPINEPHRINE 1 %-1:100000 IJ SOLN
INTRAMUSCULAR | Status: DC | PRN
Start: 2017-03-27 — End: 2017-03-27
  Administered 2017-03-27: 5 mL

## 2017-03-27 SURGICAL SUPPLY — 60 items
ADH SKN CLS APL DERMABOND .7 (GAUZE/BANDAGES/DRESSINGS) ×1
ALLOGRAFT 7X14X11 (Bone Implant) ×1 IMPLANT
ALLOGRAFT CA 6X14X11 (Bone Implant) ×1 IMPLANT
BAG DECANTER FOR FLEXI CONT (MISCELLANEOUS) ×2 IMPLANT
BIT DRILL 14X2.5XNS TI ANT (BIT) IMPLANT
BIT DRILL AVIATOR 14 (BIT) ×2
BIT DRILL NEURO 2X3.1 SFT TUCH (MISCELLANEOUS) ×1 IMPLANT
BIT DRL 14X2.5XNS TI ANT (BIT) ×1
BLADE ULTRA TIP 2M (BLADE) ×2 IMPLANT
CANISTER SUCT 3000ML PPV (MISCELLANEOUS) ×2 IMPLANT
CARTRIDGE OIL MAESTRO DRILL (MISCELLANEOUS) ×1 IMPLANT
COVER MAYO STAND STRL (DRAPES) ×2 IMPLANT
DECANTER SPIKE VIAL GLASS SM (MISCELLANEOUS) ×2 IMPLANT
DERMABOND ADVANCED (GAUZE/BANDAGES/DRESSINGS) ×1
DERMABOND ADVANCED .7 DNX12 (GAUZE/BANDAGES/DRESSINGS) ×1 IMPLANT
DIFFUSER DRILL AIR PNEUMATIC (MISCELLANEOUS) ×2 IMPLANT
DRAPE HALF SHEET 40X57 (DRAPES) ×1 IMPLANT
DRAPE LAPAROTOMY 100X72 PEDS (DRAPES) ×2 IMPLANT
DRAPE MICROSCOPE LEICA (MISCELLANEOUS) ×2 IMPLANT
DRAPE POUCH INSTRU U-SHP 10X18 (DRAPES) ×2 IMPLANT
DRILL NEURO 2X3.1 SOFT TOUCH (MISCELLANEOUS) ×2
ELECT COATED BLADE 2.86 ST (ELECTRODE) ×2 IMPLANT
ELECT REM PT RETURN 9FT ADLT (ELECTROSURGICAL) ×2
ELECTRODE REM PT RTRN 9FT ADLT (ELECTROSURGICAL) ×1 IMPLANT
GAUZE SPONGE 4X4 12PLY STRL (GAUZE/BANDAGES/DRESSINGS) ×1 IMPLANT
GLOVE BIOGEL PI IND STRL 8 (GLOVE) ×1 IMPLANT
GLOVE BIOGEL PI INDICATOR 8 (GLOVE) ×4
GLOVE ECLIPSE 7.5 STRL STRAW (GLOVE) ×6 IMPLANT
GLOVE EXAM NITRILE LRG STRL (GLOVE) IMPLANT
GLOVE EXAM NITRILE XL STR (GLOVE) IMPLANT
GLOVE EXAM NITRILE XS STR PU (GLOVE) IMPLANT
GOWN STRL REUS W/ TWL LRG LVL3 (GOWN DISPOSABLE) IMPLANT
GOWN STRL REUS W/ TWL XL LVL3 (GOWN DISPOSABLE) IMPLANT
GOWN STRL REUS W/TWL 2XL LVL3 (GOWN DISPOSABLE) ×2 IMPLANT
GOWN STRL REUS W/TWL LRG LVL3 (GOWN DISPOSABLE)
GOWN STRL REUS W/TWL XL LVL3 (GOWN DISPOSABLE) ×4
HALTER HD/CHIN CERV TRACTION D (MISCELLANEOUS) ×2 IMPLANT
HEMOSTAT POWDER KIT SURGIFOAM (HEMOSTASIS) ×2 IMPLANT
KIT BASIN OR (CUSTOM PROCEDURE TRAY) ×2 IMPLANT
KIT ROOM TURNOVER OR (KITS) ×2 IMPLANT
NDL HYPO 25X1 1.5 SAFETY (NEEDLE) ×1 IMPLANT
NDL SPNL 22GX3.5 QUINCKE BK (NEEDLE) ×2 IMPLANT
NEEDLE HYPO 25X1 1.5 SAFETY (NEEDLE) ×2 IMPLANT
NEEDLE SPNL 22GX3.5 QUINCKE BK (NEEDLE) ×4 IMPLANT
NS IRRIG 1000ML POUR BTL (IV SOLUTION) ×2 IMPLANT
OIL CARTRIDGE MAESTRO DRILL (MISCELLANEOUS) ×2
PACK LAMINECTOMY NEURO (CUSTOM PROCEDURE TRAY) ×2 IMPLANT
PAD ARMBOARD 7.5X6 YLW CONV (MISCELLANEOUS) ×6 IMPLANT
PLATE AVIATOR ASSY 2LVL SZ 30 (Plate) ×1 IMPLANT
RUBBERBAND STERILE (MISCELLANEOUS) ×4 IMPLANT
SCREW AVIATOR VAR SELFTAP 4X14 (Screw) ×6 IMPLANT
SPONGE INTESTINAL PEANUT (DISPOSABLE) ×2 IMPLANT
SPONGE SURGIFOAM ABS GEL 100 (HEMOSTASIS) ×2 IMPLANT
STAPLER SKIN PROX WIDE 3.9 (STAPLE) IMPLANT
SUT VIC AB 2-0 CP2 18 (SUTURE) ×2 IMPLANT
SUT VIC AB 3-0 SH 8-18 (SUTURE) ×3 IMPLANT
TAPE CLOTH SURG 4X10 WHT LF (GAUZE/BANDAGES/DRESSINGS) ×1 IMPLANT
TOWEL GREEN STERILE (TOWEL DISPOSABLE) ×2 IMPLANT
TOWEL GREEN STERILE FF (TOWEL DISPOSABLE) ×2 IMPLANT
WATER STERILE IRR 1000ML POUR (IV SOLUTION) ×2 IMPLANT

## 2017-03-27 NOTE — Anesthesia Procedure Notes (Signed)
Procedure Name: MAC Date/Time: 03/27/2017 7:56 AM Performed by: Carney Living Pre-anesthesia Checklist: Patient identified, Emergency Drugs available, Suction available, Patient being monitored and Timeout performed Patient Re-evaluated:Patient Re-evaluated prior to induction Oxygen Delivery Method: Circle system utilized Preoxygenation: Pre-oxygenation with 100% oxygen Induction Type: IV induction Ventilation: Mask ventilation without difficulty and Oral airway inserted - appropriate to patient size Laryngoscope Size: Mac and 4 Grade View: Grade I Tube type: Oral Tube size: 7.5 mm Number of attempts: 1 Airway Equipment and Method: Stylet Placement Confirmation: ETT inserted through vocal cords under direct vision,  positive ETCO2 and breath sounds checked- equal and bilateral Secured at: 23 cm Tube secured with: Tape Dental Injury: Teeth and Oropharynx as per pre-operative assessment  Comments: Pt easily intubated with neck in the neutral position

## 2017-03-27 NOTE — H&P (Signed)
Subjective: Patient is a 56 y.o. right-handed white male who is admitted for treatment of multilevel spondylitic cervical disc herniation, with canal and neural foraminal stenosis with resulting bilateral upper extremity weakness and pain. Patient has had multiple rotator cuff surgeries, but is having increasing difficulties with weakness in the upper extremities bilaterally, left worse than right, with tingling through the upper extremities. He has posterior neck pain and stiffness. His 5 level degeneration, with the worst degeneration and impingement seen at the C5-6 and C6-7 levels, and is admitted now for 2 level C5-6 and C6-7 anterior cervical decompression and arthrodesis with structural allograft and cervical plating.   Patient Active Problem List   Diagnosis Date Noted  . Chest pain 04/17/2011  . Dyspnea 04/17/2011  . Colon polyposis 12/25/2010  . OTITIS MEDIA 08/30/2010  . NEOPLASM OF UNCERTAIN BEHAVIOR OF SKIN 08/09/2010  . SINUSITIS, ACUTE 08/09/2010  . CAROTID BRUIT 08/09/2010  . OBESITY 11/30/2009  . ANXIETY 03/19/2009  . UNSPEC VENTRAL HERN W/O MENTION OBST/GANGREN 03/31/2008  . TOBACCO USE DISORDER/SMOKER-SMOKING CESSATION DISCUSSED 09/30/2007  . DIABETES MELLITUS, TYPE II 07/07/2007  . HYPOGONADISM 06/11/2007  . HYPERLIPIDEMIA 06/11/2007  . CORONARY ARTERY DISEASE 06/11/2007  . GERD 06/11/2007  . BARRETT'S ESOPHAGUS, HX OF 06/11/2007  . Other acquired absence of organ 06/11/2007  . Other postprocedural status(V45.89) 06/11/2007   Past Medical History:  Diagnosis Date  . Anxiety state, unspecified   . Arthritis   . Asthma   . Colon polyposis   . Complication of anesthesia    pt has difficulty awakening; also has increase with BP   . Coronary atherosclerosis of unspecified type of vessel, native or graft   . Esophageal reflux   . Family history of diabetes mellitus   . Heart murmur    since birth  . Other and unspecified hyperlipidemia   . Other testicular  hypofunction   . Personal history of other diseases of digestive system   . PONV (postoperative nausea and vomiting)   . Tobacco use disorder   . Type II or unspecified type diabetes mellitus without mention of complication, not stated as uncontrolled   . Ventral hernia, unspecified, without mention of obstruction or gangrene     Past Surgical History:  Procedure Laterality Date  . APPENDECTOMY    . CARDIAC CATHETERIZATION    . COLON SURGERY    . ROTATOR CUFF REPAIR     right and left  . SEPTOPLASTY    . SHOULDER ARTHROSCOPY WITH SUBACROMIAL DECOMPRESSION AND OPEN ROTATOR C Right 07/05/2016   Procedure: RIGHT SHOULDER ARTHROSCOPY WITH SUBACROMIAL DECOMPRESSION;  Surgeon: Susa Day, MD;  Location: [facility];  Service: Orthopedics;  Laterality: Right;  . TONSILLECTOMY    . TONSILLECTOMY      Prescriptions Prior to Admission  Medication Sig Dispense Refill Last Dose  . clomiPHENE (CLOMID) 50 MG tablet Take 50 mg by mouth 2 (two) times a week. Mon and Fri   Past Week at Unknown time  . cyclobenzaprine (FLEXERIL) 10 MG tablet Take 10 mg by mouth at bedtime.    03/26/2017 at Unknown time  . dapagliflozin propanediol (FARXIGA) 10 MG TABS tablet Take 10 mg by mouth at bedtime.   03/26/2017 at Unknown time  . gabapentin (NEURONTIN) 300 MG capsule Take 300 mg by mouth 2 (two) times daily.   03/27/2017 at 0445  . glimepiride (AMARYL) 4 MG tablet Take 4 mg by mouth daily.   03/26/2017 at Unknown time  . LORazepam (ATIVAN) 1 MG tablet Take  0.5-1 mg by mouth at bedtime as needed for sleep.    Past Week at Unknown time  . metFORMIN (GLUCOPHAGE) 500 MG tablet Take 1,000 mg by mouth 2 (two) times daily with a meal.    03/26/2017 at Unknown time  . oxyCODONE-acetaminophen (PERCOCET) 5-325 MG tablet Take 1-2 tablets by mouth every 4 (four) hours as needed for severe pain. (Patient taking differently: Take 1 tablet by mouth at bedtime. ) 40 tablet 0 03/26/2017 at Unknown time  . repaglinide (PRANDIN) 0.5 MG  tablet Take 0.5 mg by mouth 3 (three) times daily before meals.   03/26/2017 at Unknown time  . tamsulosin (FLOMAX) 0.4 MG CAPS capsule Take 0.4 mg by mouth 2 (two) times daily.   03/27/2017 at 0445   Allergies  Allergen Reactions  . Iohexol Shortness Of Breath     unkown  . Ivp Dye [Iodinated Diagnostic Agents] Shortness Of Breath and Nausea And Vomiting  . Codeine Hives, Itching and Nausea And Vomiting    Social History  Substance Use Topics  . Smoking status: Current Every Day Smoker    Packs/day: 0.50    Years: 40.00    Types: Cigarettes  . Smokeless tobacco: Never Used  . Alcohol use No    Family History  Problem Relation Age of Onset  . Cancer Mother        lung  . Cancer Father        prostate  . Diabetes Other      Review of Systems Pertinent items noted in HPI and remainder of comprehensive ROS otherwise negative.  Objective: Vital signs in last 24 hours: Temp:  [97.9 F (36.6 C)] 97.9 F (36.6 C) (08/22 0611) Pulse Rate:  [68] 68 (08/22 0611) Resp:  [19] 19 (08/22 0611) BP: (106)/(69) 106/69 (08/22 0611) SpO2:  [95 %] 95 % (08/22 0611) Weight:  [91.1 kg (200 lb 12 oz)] 91.1 kg (200 lb 12 oz) (08/22 0545)  EXAM: Patient well-developed well-nourished white male in no acute distress. Lungs are clear to auscultation , the patient has symmetrical respiratory excursion. Heart has a regular rate and rhythm normal S1 and S2 no murmur.   Abdomen is soft nontender nondistended bowel sounds are present. Extremity examination shows no clubbing cyanosis or edema. Neurologic examination shows the deltoid is 5 bilaterally, left biceps is 4, right biceps is 44+. Left triceps is 4, right triceps is 4+. Left intrinsics are 4+, right intrinsics are 5. Left grip is 4-4+. Right grip is 5. Iliopsoas is 5 bilaterally. Sensation is intact to pinprick in the digits of the upper extremities. Reflexes are absent at the biceps and brachioradialis bilaterally, as well as the left triceps. The  right triceps is trace. Quadriceps are 1 by. Gastrocnemius is absent bilaterally. Toes are downgoing bilaterally. Patient has a normal gait and stance.  Data Review:CBC    Component Value Date/Time   WBC 9.8 03/22/2017 0920   RBC 5.55 03/22/2017 0920   HGB 16.0 03/22/2017 0920   HCT 47.4 03/22/2017 0920   PLT 184 03/22/2017 0920   MCV 85.4 03/22/2017 0920   MCH 28.8 03/22/2017 0920   MCHC 33.8 03/22/2017 0920   RDW 13.2 03/22/2017 0920   LYMPHSABS 0.9 10/09/2011 2034   MONOABS 0.6 10/09/2011 2034   EOSABS 0.0 10/09/2011 2034   BASOSABS 0.0 10/09/2011 2034                          BMET  Component Value Date/Time   NA 132 (L) 03/22/2017 0920   K 4.2 03/22/2017 0920   CL 101 03/22/2017 0920   CO2 22 03/22/2017 0920   GLUCOSE 379 (H) 03/22/2017 0920   BUN 11 03/22/2017 0920   CREATININE 0.95 03/22/2017 0920   CALCIUM 8.7 (L) 03/22/2017 0920   GFRNONAA >60 03/22/2017 0920   GFRAA >60 03/22/2017 0920     Assessment/Plan: Patient with neck pain bilateral cervical radicular pain, numbness, paresthesias, and weakness. He has 5 level cervical degeneration, with the worst spondylitic disc herniation and stenosis seen at the C5-6 and C6-7 levels, and is admitted a 2 level ACDF.  I've discussed with the patient the nature of his condition, the nature the surgical procedure, the typical length of surgery, hospital stay, and overall recuperation. We discussed limitations postoperatively. I discussed risks of surgery including risks of infection, bleeding, possibly need for transfusion, the risk of nerve root dysfunction with pain, weakness, numbness, or paresthesias, the risk of spinal cord dysfunction with paralysis of all 4 limbs and quadriplegia, and the risk of dural tear and CSF leakage and possible need for further surgery, the risk of esophageal dysfunction causing dysphagia and the risk of laryngeal dysfunction causing hoarseness of the voice, the risk of failure of the arthrodesis  and the possible need for further surgery, and the risk of anesthetic complications including myocardial infarction, stroke, pneumonia, and death. We also discussed the need for postoperative immobilization in a cervical collar. Understanding all this the patient does wish to proceed with surgery and is admitted for such.    Hosie Spangle, MD 03/27/2017 7:20 AM

## 2017-03-27 NOTE — Progress Notes (Signed)
Orthopedic Tech Progress Note Patient Details:  Chase Sanchez 08/31/1960 [id number]  Ortho Devices Type of Ortho Device: Soft collar Ortho Device/Splint Location: Dropped off soft Collar for pt in Room 3C03 Chrissie Noa Monier).   Kristopher Oppenheim 03/27/2017, 1:15 PM

## 2017-03-27 NOTE — Op Note (Addendum)
03/27/2017  10:42 AM  PATIENT:  Chase Sanchez  56 y.o. male  PRE-OPERATIVE DIAGNOSIS:  C5-6 and C6-7 cervical disc herniation, cervical spondylosis, cervical degenerative disease, cervical radiculopathy, bilateral upper extremity weakness  POST-OPERATIVE DIAGNOSIS:  C5-6 and C6-7 cervical disc herniation, cervical spondylosis, cervical degenerative disease, cervical radiculopathy, bilateral upper extremity weakness  PROCEDURE:  Procedure(s):  C5-6 and C6-7 anterior cervical decompression and arthrodesis with structural allograft and aviator cervical plating  SURGEON:  Surgeon(s): Jovita Gamma, MD  ANESTHESIA:   general  EBL:  Total I/O In: 1250 [I.V.:1000; IV Piggyback:250] Out: 75 [Blood:75]  BLOOD ADMINISTERED:none  COUNT: Correct per nursing staff  DICTATION: Patient was brought to the operating room placed under general endotracheal anesthesia. Patient was placed in 10 pounds of halter traction. The neck was prepped with Betadine soap and solution and draped in a sterile fashion. A horizontal incision was made on the left side of the neck. The line of the incision was infiltrated with local anesthetic with epinephrine. Dissection was carried down thru the subcutaneous tissue and platysma, bipolar cautery was used to maintain hemostasis. Dissection was then carried out thru an avascular plane leaving the sternocleidomastoid carotid artery and jugular vein laterally and the trachea and esophagus medially. The ventral aspect of the vertebral column was identified and a localizing x-ray was taken. The C5-6 and C6-7 levels were identified. The annulus at each level was incised and the disc space entered. Anterior osteophytic overgrowth was removed using the osteophyte removal tool and the high-speed drill. Discectomy was performed with micro-curettes and pituitary rongeurs. The operating microscope was draped and brought into the field provided additional magnification illumination and  visualization. Discectomy was continued posteriorly thru the disc space and then the cartilaginous endplate was removed using micro-curettes along with the high-speed drill. Posterior osteophytic overgrowth was removed each level using the high-speed drill along with a 2 mm thin footplated Kerrison punch. Posterior longitudinal ligament along with disc herniation was carefully removed, decompressing the spinal canal and thecal sac. We then continued to remove osteophytic overgrowth and disc material decompressing the neural foramina and exiting nerve roots bilaterally. Once the decompression was completed hemostasis was established at each level with the use of Gelfoam with thrombin and bipolar cautery. The Gelfoam was removed, a thin layer of Surgifoam applied, the wound irrigated and hemostasis confirmed. We then measured the height of the intravertebral disc space level and selected a 7 millimeter in height structural allograft for the C5-6 level and a 6 millimeter in height structural allograft for the C6-7 level . Each was hydrated and saline solution and then gently positioned in the intravertebral disc space and countersunk. We then selected a 30 millimeter in height Aviator cervical plate. It was positioned over the fusion construct and secured to the vertebra with a pair of 4 x 14 mm variable self-tapping screws at the C5 level, a pair of 4 x 14 mm variable self-tapping screws at the C6 level, and a pair of 4 x 14 mm variable self-tapping screws at the C7 level. Each screw hole was started with the high-speed drill and then the screws placed, once all the screws were placed, the locking system was secured. The wound was irrigated with bacitracin solution checked for hemostasis which was established and confirmed. An x-ray was taken which showed the C5-6 level, where the plate and screws were in good position, however the shoulders obscured the view of the C6-7 level, however under direct visualization the  grafts, plate and screws  were in good position at all levels. We again checked for hemostasis, the wound was irrigated extensively with bacitracin solution, and once hemostasis was confirmed we proceeded with closure. The platysma was closed with interrupted inverted 2-0 undyed Vicryl suture, the subcutaneous and subcuticular closed with interrupted inverted 3-0 undyed Vicryl suture. The skin edges were approximated with Dermabond. Following surgery the patient was taken out of cervical traction. To be reversed and the anesthetic and taken to the recovery room for further care.   PLAN OF CARE: Admit for overnight observation  PATIENT DISPOSITION:  PACU - hemodynamically stable.   Delay start of Pharmacological VTE agent (>24hrs) due to surgical blood loss or risk of bleeding:  yes

## 2017-03-27 NOTE — Transfer of Care (Signed)
Immediate Anesthesia Transfer of Care Note  Patient: Chase Sanchez  Procedure(s) Performed: Procedure(s): ANTERIOR CERVICAL DECOMPRESSION/DISCECTOMY FUSION CERVICAL FIVE- CERVICAL SIX, CERVICAL SIX- CERVICAL SEVEN (N/A)  Patient Location: PACU  Anesthesia Type:General  Level of Consciousness: awake and alert   Airway & Oxygen Therapy: Patient Spontanous Breathing and Patient connected to nasal cannula oxygen  Post-op Assessment: Report given to RN and Post -op Vital signs reviewed and stable  Post vital signs: Reviewed and stable  Last Vitals:  Vitals:   03/27/17 0611 03/27/17 1054  BP: 106/69   Pulse: 68   Resp: 19   Temp: 36.6 C 37.1 C  SpO2: 95%     Last Pain:  Vitals:   03/27/17 0611  TempSrc: Oral         Complications: No apparent anesthesia complications

## 2017-03-27 NOTE — Anesthesia Postprocedure Evaluation (Signed)
Anesthesia Post Note  Patient: Chase Sanchez  Procedure(s) Performed: Procedure(s) (LRB): ANTERIOR CERVICAL DECOMPRESSION/DISCECTOMY FUSION CERVICAL FIVE- CERVICAL SIX, CERVICAL SIX- CERVICAL SEVEN (N/A)     Patient location during evaluation: PACU Anesthesia Type: General Level of consciousness: sedated Pain management: pain level controlled Vital Signs Assessment: post-procedure vital signs reviewed and stable Respiratory status: spontaneous breathing and respiratory function stable Cardiovascular status: stable Anesthetic complications: no    Last Vitals:  Vitals:   03/27/17 1123 03/27/17 1139  BP: 111/68 101/60  Pulse: 80 77  Resp: 11 (!) 8  Temp:    SpO2: 93% 94%    Last Pain:  Vitals:   03/27/17 1145  TempSrc:   PainSc: Asleep                 Azim Gillingham DANIEL

## 2017-03-27 NOTE — Progress Notes (Signed)
Vitals:   03/27/17 1254 03/27/17 1259 03/27/17 1322 03/27/17 1740  BP: (!) 97/57 106/67 136/85 120/69  Pulse: 90 77 91 75  Resp: 19 12 18 18   Temp:  (!) 97.3 F (36.3 C) 98.2 F (36.8 C) 98.5 F (36.9 C)  TempSrc:      SpO2: 96% 96% 94% 94%  Weight:      Height:        Patient resting in bed comfortably. Has ambulated in the halls. Voiding. Wound clean and dry. No swelling, erythema, or drainage. He notes that he is having some posterior neck and upper back pain and also some pain down to the right shoulder but overall the upper extremities feel much better, particularly the left upper extremity.  Plan: Encouraged to ambulate more in the halls. Continue to progress through postoperative recovery.  Hosie Spangle, MD 03/27/2017, 7:32 PM

## 2017-03-28 ENCOUNTER — Encounter (HOSPITAL_COMMUNITY): Payer: Self-pay | Admitting: Neurosurgery

## 2017-03-28 DIAGNOSIS — M50123 Cervical disc disorder at C6-C7 level with radiculopathy: Secondary | ICD-10-CM | POA: Diagnosis not present

## 2017-03-28 LAB — GLUCOSE, CAPILLARY
Glucose-Capillary: 210 mg/dL — ABNORMAL HIGH (ref 65–99)
Glucose-Capillary: 212 mg/dL — ABNORMAL HIGH (ref 65–99)

## 2017-03-28 MED ORDER — CYCLOBENZAPRINE HCL 10 MG PO TABS: 10.0000 mg | ORAL_TABLET | Freq: Three times a day (TID) | ORAL | Status: DC | PRN

## 2017-03-28 MED ORDER — CYCLOBENZAPRINE HCL 10 MG PO TABS: 10.0000 mg | ORAL_TABLET | Freq: Three times a day (TID) | ORAL | 1 refills | Status: DC | PRN

## 2017-03-28 MED ORDER — HYDROCODONE-ACETAMINOPHEN 5-325 MG PO TABS: 1.0000 | ORAL_TABLET | ORAL | 0 refills | Status: DC | PRN

## 2017-03-28 NOTE — Discharge Summary (Signed)
Physician Discharge Summary  Patient ID: Chase Sanchez MRN: [id number] DOB/AGE: Dec 31, 1960 56 y.o.  Admit date: 03/27/2017 Discharge date: 03/28/2017  Admission Diagnoses:  C5-6 and C6-7 cervical disc herniation, cervical spondylosis, cervical degenerative disease, cervical radiculopathy, bilateral upper extremity weakness  Discharge Diagnoses:  C5-6 and C6-7 cervical disc herniation, cervical spondylosis, cervical degenerative disease, cervical radiculopathy, bilateral upper extremity weakness Active Problems:   HNP (herniated nucleus pulposus), cervical   Discharged Condition: good  Hospital Course: Patient admitted, underwent a 2 level C5-6 and C6-7 ACDF. Postoperatively he is done well. He is up and ambulating actively. His incision is healing nicely. He is asking to be discharged to home. He has been given instructions regarding wound care and activities. He is scheduled to follow-up with me in the office in 3 weeks.  Discharge Exam: Blood pressure 116/78, pulse 62, temperature 97.8 F (36.6 C), temperature source Oral, resp. rate 18, height 5' 8.5" (1.74 m), weight 91.1 kg (200 lb 12 oz), SpO2 99 %.  Disposition: Home  Discharge Instructions    Discharge wound care:    Complete by:  As directed    Leave the wound open to air. Shower daily with the wound uncovered. Water and soapy water should run over the incision area. Do not wash directly on the incision for 2 weeks. Remove the glue after 2 weeks.   Driving Restrictions    Complete by:  As directed    No driving for 2 weeks. May ride in the car locally now. May begin to drive locally in 2 weeks.   Other Restrictions    Complete by:  As directed    Walk gradually increasing distances out in the fresh air at least twice a day. Walking additional 6 times inside the house, gradually increasing distances, daily. No bending, lifting, or twisting. Perform activities between shoulder and waist height (that is at counter height when  standing or table height when sitting).     Allergies as of 03/28/2017      Reactions   Iohexol Shortness Of Breath    unkown   Ivp Dye [iodinated Diagnostic Agents] Shortness Of Breath, Nausea And Vomiting   Codeine Hives, Itching, Nausea And Vomiting      Medication List    TAKE these medications   clomiPHENE 50 MG tablet Commonly known as:  CLOMID Take 50 mg by mouth 2 (two) times a week. Mon and Fri   cyclobenzaprine 10 MG tablet Commonly known as:  FLEXERIL Take 10 mg by mouth at bedtime. What changed:  Another medication with the same name was added. Make sure you understand how and when to take each.   cyclobenzaprine 10 MG tablet Commonly known as:  FLEXERIL Take 1 tablet (10 mg total) by mouth 3 (three) times daily as needed for muscle spasms. What changed:  You were already taking a medication with the same name, and this prescription was added. Make sure you understand how and when to take each.   FARXIGA 10 MG Tabs tablet Generic drug:  dapagliflozin propanediol Take 10 mg by mouth at bedtime.   gabapentin 300 MG capsule Commonly known as:  NEURONTIN Take 300 mg by mouth 2 (two) times daily.   glimepiride 4 MG tablet Commonly known as:  AMARYL Take 4 mg by mouth daily.   HYDROcodone-acetaminophen 5-325 MG tablet Commonly known as:  NORCO/VICODIN Take 1-2 tablets by mouth every 4 (four) hours as needed (pain).   LORazepam 1 MG tablet Commonly known as:  ATIVAN  Take 0.5-1 mg by mouth at bedtime as needed for sleep.   metFORMIN 500 MG tablet Commonly known as:  GLUCOPHAGE Take 1,000 mg by mouth 2 (two) times daily with a meal.   oxyCODONE-acetaminophen 5-325 MG tablet Commonly known as:  PERCOCET Take 1-2 tablets by mouth every 4 (four) hours as needed for severe pain. What changed:  how much to take  when to take this   repaglinide 0.5 MG tablet Commonly known as:  PRANDIN Take 0.5 mg by mouth 3 (three) times daily before meals.   tamsulosin  0.4 MG Caps capsule Commonly known as:  FLOMAX Take 0.4 mg by mouth 2 (two) times daily.            Discharge Care Instructions        Start     Ordered   03/28/17 0000  cyclobenzaprine (FLEXERIL) 10 MG tablet  3 times daily PRN     03/28/17 1418   03/28/17 0000  HYDROcodone-acetaminophen (NORCO/VICODIN) 5-325 MG tablet  Every 4 hours PRN     03/28/17 1418   03/28/17 0000  Driving Restrictions    Comments:  No driving for 2 weeks. May ride in the car locally now. May begin to drive locally in 2 weeks.   03/28/17 1418   03/28/17 0000  Other Restrictions    Comments:  Walk gradually increasing distances out in the fresh air at least twice a day. Walking additional 6 times inside the house, gradually increasing distances, daily. No bending, lifting, or twisting. Perform activities between shoulder and waist height (that is at counter height when standing or table height when sitting).   03/28/17 1418   03/28/17 0000  Discharge wound care:    Comments:  Leave the wound open to air. Shower daily with the wound uncovered. Water and soapy water should run over the incision area. Do not wash directly on the incision for 2 weeks. Remove the glue after 2 weeks.   03/28/17 1418       Signed: Hosie Spangle, MD 03/28/2017, 2:18 PM

## 2017-03-28 NOTE — Progress Notes (Signed)
Inpatient Diabetes Program Recommendations  AACE/ADA: New Consensus Statement on Inpatient Glycemic Control (2015)  Target Ranges:  Prepandial:   less than 140 mg/dL      Peak postprandial:   less than 180 mg/dL (1-2 hours)      Critically ill patients:  140 - 180 mg/dL   Lab Results  Component Value Date   GLUCAP 210 (H) 03/28/2017   HGBA1C 8.7 (H) 06/27/2016    Review of Glycemic Control Results for Chase Sanchez, MCKENZIE (MRN [id number]) as of 03/28/2017 09:46  Ref. Range 03/27/2017 06:10 03/27/2017 10:55 03/27/2017 17:36 03/27/2017 21:38 03/28/2017 07:29  Glucose-Capillary Latest Ref Range: 65 - 99 mg/dL 137 (H) 211 (H) 336 (H) 268 (H) 210 (H)   Inpatient Diabetes Program Recommendations:    Please consider A1c to determine prehospital glycemic control.  Thank you, Nani Gasser. Deyana Wnuk, RN, MSN, CDE  Diabetes Coordinator Inpatient Glycemic Control Team Team Pager [phone] (8am-5pm) 03/28/2017 9:48 AM

## 2017-03-28 NOTE — Progress Notes (Signed)
Patient alert and oriented, mae's well, voiding adequate amount of urine, swallowing without difficulty, c/o mild pain at time of discharge. Patient discharged home with family. Script and discharged instructions given to patient. Patient and family stated understanding of instructions given. Patient has an appointment with Dr. Nudelman.  

## 2017-03-28 NOTE — Discharge Instructions (Signed)

## 2018-07-17 ENCOUNTER — Other Ambulatory Visit: Payer: Self-pay | Admitting: Neurosurgery

## 2018-07-17 DIAGNOSIS — Z981 Arthrodesis status: Secondary | ICD-10-CM

## 2018-07-21 ENCOUNTER — Ambulatory Visit
Admission: RE | Admit: 2018-07-21 | Discharge: 2018-07-21 | Disposition: A | Discharge status: Home / Self Care | Payer: BC Managed Care – PPO | Source: Ambulatory Visit | Attending: Neurosurgery | Admitting: Neurosurgery

## 2018-07-21 DIAGNOSIS — Z981 Arthrodesis status: Secondary | ICD-10-CM

## 2018-09-26 ENCOUNTER — Other Ambulatory Visit: Payer: Self-pay | Admitting: Urology

## 2018-10-02 ENCOUNTER — Encounter (HOSPITAL_BASED_OUTPATIENT_CLINIC_OR_DEPARTMENT_OTHER): Payer: Self-pay | Admitting: *Deleted

## 2018-10-02 ENCOUNTER — Other Ambulatory Visit: Payer: Self-pay

## 2018-10-02 NOTE — Progress Notes (Signed)
Spoke w/ pt via phone for pre-op interview.  Npo after mn w/ exception clear liquids until 0745.  Arrive at 1145.  Needs istat and ekg.  Will take flomax am dos w/ sips of water.

## 2018-10-06 NOTE — H&P (Signed)
Office Visit Report     09/24/2018   --------------------------------------------------------------------------------   Chase Sanchez  MRN: [id number]  PRIMARY CARE:  Donavan Burnet, MD  DOB: [birth year], 58 year old Male  REFERRING:    SSN: [id number]  PROVIDER:  Festus Aloe, M.D.    LOCATION:  [facility] [phone]   --------------------------------------------------------------------------------   CC: I have symptoms of an enlarged prostate.  HPI: Chase Sanchez is a 58 year-old male established patient who is here for symptoms of enlarged prostate.  He first noticed the symptoms approximately 06/07/2011. His symptoms have not gotten worse over the last year. He has been treated with Flomax and Rapaflo. The patient has never had a surgical procedure for bladder outlet obstruction to his prostate.   May 2017 with a history of frequency and weak stream worse x 2 weeks. He was found to have a postvoid of 700 cc. A Foley was placed and started on tamsulosin. He passed a void trial. BPH with similar symptoms in 2012. His Dec 2016 PSA was 0.73. F/u PVR 230 ml.   He has occasional weak stream and went to tamsulosin BID. His Jan 2018 PSA was 0.85. PVR 132 mL with a nl DRE Feb 2018.   PSA remains low. PVR 320 ml. AUASS = 7, pleased. UA clear. If he misses a dose or two of tamsulosin he has hesitancy and a weak stream. His c-spine was fused Sep 2018. Jan 2019 cysto showed obstructing lateral lobes.   He underwent Urolift October 28, 2017. He had a fairly high bladder neck which was tiny but no median lobe. He had his catheter removed this morning. 120 ml was instilled and he voided 100 ml. he returns this afternoon and said he's had 4 episodes of urgency and a painful inability to pass urine. His postvoid 250 mL on the bladder scan.   He returns and his post void is 305 mL. He has urgency and hesitancy. He has a weak stream and he feels like he is voiding "pebbles" or like  "something is rattling around".     ALLERGIES: Codeine Derivatives Contrast Media Ready-Box MISC    MEDICATIONS: Tamsulosin Hcl 0.4 mg capsule 1 capsule PO BID  Aspirin 81 MG TABS Oral  Farxiga 10 mg tablet Oral  Flexeril  Gabapentin 300 mg capsule capsule PRN  Glimepiride 4 mg tablet Oral  LORazepam 1 MG Oral Tablet Oral  MetFORMIN HCl - 500 MG Oral Tablet Oral  Repaglinide 0.5 mg tablet     GU PSH: Cystoscopy - 08/12/2017 UROLIFT - 10/28/2017, 10/28/2017 UROLIFT - 10/28/2017, 10/28/2017      PSH Notes: Appendectomy, Colon Surgery, Shoulder Surgery, Shoulder Surgery   NON-GU PSH: Appendectomy - 2012 Neck Spine Disk Surgery, C5, 6, 7 fusion - about 04/06/2017 Rotator cuff surgery, Right    GU PMH: Dysuria - 11/13/2017 BPH w/LUTS - 11/01/2017, - 10/30/2017, - 10/28/2017, - 08/12/2017, - 07/17/2017, - 01/21/2017, - 2018, - 06/19/2016, Benign localized prostatic hyperplasia with lower urinary tract symptoms (LUTS), - 2017 ED due to arterial insufficiency - 07/17/2017, - 01/21/2017 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, - 01/21/2017, - 2018 Urinary Retention, Other retention of urine - 2017 Weak Urinary Stream, Weak urinary stream - 2017 Spermatocele of epididymis, Unspec, Spermatocele - 2014      PMH Notes:  2010-11-13 11:16:21 - Note: Arthritis   NON-GU PMH: Encounter for general adult medical examination without abnormal findings, Encounter for preventive health examination - 2017 Asthma, Asthma - 2014 Cardiac murmur,  unspecified, Murmurs - 2014 Personal history of other diseases of the circulatory system, History of cardiac disorder - 2014 Personal history of other endocrine, nutritional and metabolic disease, History of hypercholesterolemia - 2014, History of diabetes mellitus, - 2014 Personal history of other specified conditions, History of heartburn - 2014    FAMILY HISTORY: Family Health Status - Father alive at age 65 - 54 In Family Lung Cancer - Runs In Family malignant  neoplasm of prostate - Runs In Family Mother Deceased At Age 72 from diabetic complicati - Runs In Family Tuberculosis - Runs In Family   SOCIAL HISTORY: Marital Status: Married Preferred Language: English; Ethnicity: Not Hispanic Or Latino; Race: White Current Smoking Status: Patient smokes. Smokes 1 pack per day.  Has never drank.  Does not drink caffeine. Patient's occupation is/was [employer].     Notes: Married, Occasional alcohol use, Daily caffeine consumption, 2-3 servings a day, Mother deceased, One child, Current every day smoker   REVIEW OF SYSTEMS:    GU Review Male:   Patient reports trouble starting your stream and have to strain to urinate . Patient denies frequent urination, hard to postpone urination, burning/ pain with urination, get up at night to urinate, leakage of urine, stream starts and stops, erection problems, and penile pain.  Gastrointestinal (Upper):   Patient denies nausea, vomiting, and indigestion/ heartburn.  Gastrointestinal (Lower):   Patient denies diarrhea and constipation.  Constitutional:   Patient denies fever, night sweats, weight loss, and fatigue.  Skin:   Patient denies skin rash/ lesion and itching.  Eyes:   Patient denies double vision and blurred vision.  Ears/ Nose/ Throat:   Patient denies sore throat and sinus problems.  Hematologic/Lymphatic:   Patient denies swollen glands and easy bruising.  Cardiovascular:   Patient denies leg swelling and chest pains.  Respiratory:   Patient denies cough and shortness of breath.  Endocrine:   Patient denies excessive thirst.  Musculoskeletal:   Patient denies back pain and joint pain.  Neurological:   Patient denies headaches and dizziness.  Psychologic:   Patient denies depression and anxiety.   VITAL SIGNS:      09/24/2018 12:46 PM  BP 145/87 mmHg  Pulse 78 /min  Temperature 97.6 F / 36.4 C   GU PHYSICAL EXAMINATION:    Scrotum: No lesions. No edema. No cysts. No warts.  Urethral Meatus: Normal  size. No lesion, no wart, no discharge, no polyp. Normal location.  Penis: Circumcised, no warts, no cracks. No dorsal Peyronie's plaques, no left corporal Peyronie's plaques, no right corporal Peyronie's plaques, no scarring, no warts. No balanitis, no meatal stenosis.   MULTI-SYSTEM PHYSICAL EXAMINATION:    Constitutional: Well-nourished. No physical deformities. Normally developed. Good grooming.  Neck: Neck symmetrical, not swollen. Normal tracheal position.  Respiratory: No labored breathing, no use of accessory muscles.   Cardiovascular: Normal temperature, normal extremity pulses, no swelling, no varicosities.  Skin: No paleness, no jaundice, no cyanosis. No lesion, no ulcer, no rash.  Neurologic / Psychiatric: Oriented to time, oriented to place, oriented to person. No depression, no anxiety, no agitation.  Gastrointestinal: No mass, no tenderness, no rigidity, non obese abdomen.     PAST DATA REVIEWED:  Source Of History:  Patient   01/21/17  PSA  Total PSA 0.87 ng/mL    07/17/17 03/05/17 01/21/17 11/15/16 10/05/16  Hormones  Testosterone, Total 208.5 ng/dL 206.3 ng/dL 278.2 ng/dL 271.5 pg/dL 169.5 pg/dL    PROCEDURES:  Flexible Cystoscopy - 52000  Risks, benefits, and some of the potential complications of the procedure were discussed with the patient. All questions were answered. Informed consent was obtained. Antibiotic prophylaxis was given -- Cephalexin. Sterile technique and intraurethral analgesia were used.  Meatus:  Normal size. Normal location. Normal condition.  Urethra:  No strictures.  External Sphincter:  Normal.  Verumontanum:  Normal.  Prostate:  Obstructing. Mild hyperplasia. High bladder neck.  Bladder Neck:  Non-obstructing.  Ureteral Orifices:  Normal location. Normal size. Normal shape. Effluxed clear urine.  Bladder:  One small stone. No trabeculation. No tumors. Normal mucosa.      The lower urinary tract was carefully examined. The  procedure was well-tolerated and without complications. Antibiotic instructions were given. Instructions were given to call the office immediately for bloody urine, difficulty urinating, painful urination, fever, chills, nausea, vomiting or other illness. The patient stated that he understood these instructions and would comply with them.        PVR Ultrasound - 01027  Scanned Volume: 305 cc         Urinalysis w/Scope Dipstick Dipstick Cont'd Micro  Color: Yellow Bilirubin: Neg mg/dL WBC/hpf: 0 - 5/hpf  Appearance: Clear Ketones: Neg mg/dL RBC/hpf: 0 - 2/hpf  Specific Gravity: 1.015 Blood: Trace ery/uL Bacteria: Mod (26-50/hpf)  pH: 5.5 Protein: Neg mg/dL Cystals: NS (Not Seen)  Glucose: 2+ mg/dL Urobilinogen: 0.2 mg/dL Casts: NS (Not Seen)    Nitrites: Positive Trichomonas: Not Present    Leukocyte Esterase: Neg leu/uL Mucous: Present      Epithelial Cells: NS (Not Seen)      Yeast: NS (Not Seen)      Sperm: Not Present    ASSESSMENT:      ICD-10 Details  1 GU:   BPH w/LUTS - N40.1   2   Weak Urinary Stream - R39.12   3   Bladder Stone - N21.0    PLAN:            Medications Stop Meds: Oxycodone-Acetaminophen 5 mg-325 mg tablet  Discontinue: 09/24/2018  - Reason: The medication cycle was completed.            Orders Labs Urine Culture          Schedule Return Visit/Planned Activity: Next Available Appointment - Schedule Surgery          Document Letter(s):  Created for Patient: Clinical Summary         Notes:   Bladder stone-discussed with patient nature risks and benefits of cystoscopy and cystolitholapaxy with laser. He elects to proceed. Discussed we need to check to see if any of the Urolift implants are exposed or malpositioned.   Weak stream, hesitancy-incomplete emptying-the stone is over to the left side and I wouldn't think it would be blocking his bladder. Discussed why we are there nature risks benefits and alternatives to transurethral  incision/vaporization of the prostate with the thulium laser. He said he already has trouble with ejaculation and erections. They are minimal. He elects to proceed.       * Signed by Festus Aloe, M.D. on 09/24/18 at 5:49 PM (EST)*     The information contained in this medical record document is considered private and confidential patient information. This information can only be used for the medical diagnosis and/or medical services that are being provided by the patient's selected caregivers. This information can only be distributed outside of the patient's care if the patient agrees and signs waivers of authorization for this  information to be sent to an outside source or route.  Addendum: His urine cx grew > 100,000 mixed gram pos. organisms. I started him on  Cephalexin 500 mg po BID Oct 03, 2018.

## 2018-10-07 ENCOUNTER — Encounter (HOSPITAL_BASED_OUTPATIENT_CLINIC_OR_DEPARTMENT_OTHER): Payer: Self-pay | Admitting: *Deleted

## 2018-10-07 ENCOUNTER — Other Ambulatory Visit: Payer: Self-pay

## 2018-10-07 ENCOUNTER — Ambulatory Visit (HOSPITAL_BASED_OUTPATIENT_CLINIC_OR_DEPARTMENT_OTHER)
Admission: RE | Admit: 2018-10-07 | Discharge: 2018-10-07 | Disposition: A | Discharge status: Home / Self Care | Payer: BC Managed Care – PPO | Attending: Urology | Admitting: Urology

## 2018-10-07 ENCOUNTER — Ambulatory Visit (HOSPITAL_BASED_OUTPATIENT_CLINIC_OR_DEPARTMENT_OTHER): Payer: BC Managed Care – PPO | Admitting: Anesthesiology

## 2018-10-07 ENCOUNTER — Encounter (HOSPITAL_BASED_OUTPATIENT_CLINIC_OR_DEPARTMENT_OTHER)
Admission: RE | Disposition: A | Discharge status: Home / Self Care | Payer: Self-pay | Source: Home / Self Care | Attending: Urology

## 2018-10-07 DIAGNOSIS — Z79899 Other long term (current) drug therapy: Secondary | ICD-10-CM | POA: Diagnosis not present

## 2018-10-07 DIAGNOSIS — R3916 Straining to void: Secondary | ICD-10-CM | POA: Insufficient documentation

## 2018-10-07 DIAGNOSIS — Z7984 Long term (current) use of oral hypoglycemic drugs: Secondary | ICD-10-CM | POA: Diagnosis not present

## 2018-10-07 DIAGNOSIS — R3912 Poor urinary stream: Secondary | ICD-10-CM | POA: Insufficient documentation

## 2018-10-07 DIAGNOSIS — E119 Type 2 diabetes mellitus without complications: Secondary | ICD-10-CM | POA: Diagnosis not present

## 2018-10-07 DIAGNOSIS — N3289 Other specified disorders of bladder: Secondary | ICD-10-CM | POA: Insufficient documentation

## 2018-10-07 DIAGNOSIS — N401 Enlarged prostate with lower urinary tract symptoms: Secondary | ICD-10-CM | POA: Insufficient documentation

## 2018-10-07 DIAGNOSIS — Z7982 Long term (current) use of aspirin: Secondary | ICD-10-CM | POA: Insufficient documentation

## 2018-10-07 DIAGNOSIS — N138 Other obstructive and reflux uropathy: Secondary | ICD-10-CM

## 2018-10-07 DIAGNOSIS — F17210 Nicotine dependence, cigarettes, uncomplicated: Secondary | ICD-10-CM | POA: Diagnosis not present

## 2018-10-07 DIAGNOSIS — N210 Calculus in bladder: Secondary | ICD-10-CM | POA: Diagnosis not present

## 2018-10-07 DIAGNOSIS — J449 Chronic obstructive pulmonary disease, unspecified: Secondary | ICD-10-CM | POA: Diagnosis not present

## 2018-10-07 HISTORY — DX: Palmar fascial fibromatosis (dupuytren): M72.0

## 2018-10-07 HISTORY — DX: Familial adenomatous polyposis: D13.91

## 2018-10-07 HISTORY — DX: Anxiety disorder, unspecified: F41.9

## 2018-10-07 HISTORY — DX: Benign neoplasm of colon, unspecified: D12.6

## 2018-10-07 HISTORY — DX: Personal history of other specified conditions: Z87.898

## 2018-10-07 HISTORY — DX: Calculus in bladder: N21.0

## 2018-10-07 HISTORY — PX: THULIUM LASER TURP (TRANSURETHRAL RESECTION OF PROSTATE): SHX6744

## 2018-10-07 HISTORY — DX: Benign prostatic hyperplasia with lower urinary tract symptoms: N40.1

## 2018-10-07 HISTORY — DX: Atherosclerotic heart disease of native coronary artery without angina pectoris: I25.10

## 2018-10-07 HISTORY — DX: Personal history of other medical treatment: Z92.89

## 2018-10-07 HISTORY — DX: Carpal tunnel syndrome, bilateral upper limbs: G56.03

## 2018-10-07 HISTORY — DX: Obstructive sleep apnea (adult) (pediatric): G47.33

## 2018-10-07 HISTORY — DX: Testicular hypofunction: E29.1

## 2018-10-07 HISTORY — DX: Chronic obstructive pulmonary disease, unspecified: J44.9

## 2018-10-07 HISTORY — DX: Type 2 diabetes mellitus without complications: E11.9

## 2018-10-07 HISTORY — DX: Hyperlipidemia, unspecified: E78.5

## 2018-10-07 HISTORY — DX: Unspecified right bundle-branch block: I45.10

## 2018-10-07 HISTORY — DX: Poor urinary stream: R39.12

## 2018-10-07 HISTORY — DX: Gastro-esophageal reflux disease without esophagitis: K21.9

## 2018-10-07 LAB — POCT I-STAT 4, (NA,K, GLUC, HGB,HCT)
Glucose, Bld: 188 mg/dL — ABNORMAL HIGH (ref 70–99)
HCT: 52 % (ref 39.0–52.0)
Hemoglobin: 17.7 g/dL — ABNORMAL HIGH (ref 13.0–17.0)
Potassium: 4.4 mmol/L (ref 3.5–5.1)
SODIUM: 138 mmol/L (ref 135–145)

## 2018-10-07 LAB — GLUCOSE, CAPILLARY: Glucose-Capillary: 165 mg/dL — ABNORMAL HIGH (ref 70–99)

## 2018-10-07 SURGERY — THULIUM LASER TURP (TRANSURETHRAL RESECTION OF PROSTATE)
Anesthesia: General

## 2018-10-07 MED ORDER — FENTANYL CITRATE (PF) 100 MCG/2ML IJ SOLN
INTRAMUSCULAR | Status: DC | PRN
  Administered 2018-10-07: 50 ug via INTRAVENOUS
  Administered 2018-10-07 (×2): 25 ug via INTRAVENOUS

## 2018-10-07 MED ORDER — KETOROLAC TROMETHAMINE 30 MG/ML IJ SOLN
INTRAMUSCULAR | Status: DC | PRN
  Administered 2018-10-07: 15 mg via INTRAVENOUS

## 2018-10-07 MED ORDER — KETOROLAC TROMETHAMINE 30 MG/ML IJ SOLN
30.0000 mg | Freq: Once | INTRAMUSCULAR | Status: DC | PRN
  Filled 2018-10-07: qty 1

## 2018-10-07 MED ORDER — ONDANSETRON HCL 4 MG/2ML IJ SOLN
INTRAMUSCULAR | Status: DC | PRN
  Administered 2018-10-07: 4 mg via INTRAVENOUS

## 2018-10-07 MED ORDER — CEFAZOLIN SODIUM-DEXTROSE 2-4 GM/100ML-% IV SOLN
INTRAVENOUS | Status: AC
  Filled 2018-10-07: qty 100

## 2018-10-07 MED ORDER — FENTANYL CITRATE (PF) 100 MCG/2ML IJ SOLN
25.0000 ug | INTRAMUSCULAR | Status: DC | PRN
  Administered 2018-10-07: 50 ug via INTRAVENOUS
  Filled 2018-10-07: qty 1

## 2018-10-07 MED ORDER — LIDOCAINE 2% (20 MG/ML) 5 ML SYRINGE
INTRAMUSCULAR | Status: AC
  Filled 2018-10-07: qty 5

## 2018-10-07 MED ORDER — FENTANYL CITRATE (PF) 100 MCG/2ML IJ SOLN
INTRAMUSCULAR | Status: AC
  Filled 2018-10-07: qty 2

## 2018-10-07 MED ORDER — DEXAMETHASONE SODIUM PHOSPHATE 10 MG/ML IJ SOLN
INTRAMUSCULAR | Status: AC
  Filled 2018-10-07: qty 1

## 2018-10-07 MED ORDER — CEFAZOLIN SODIUM-DEXTROSE 2-4 GM/100ML-% IV SOLN
2.0000 g | INTRAVENOUS | Status: AC
  Administered 2018-10-07: 2 g via INTRAVENOUS
  Filled 2018-10-07: qty 100

## 2018-10-07 MED ORDER — DEXAMETHASONE SODIUM PHOSPHATE 10 MG/ML IJ SOLN
INTRAMUSCULAR | Status: DC | PRN
  Administered 2018-10-07: 4 mg via INTRAVENOUS

## 2018-10-07 MED ORDER — WHITE PETROLATUM EX OINT
TOPICAL_OINTMENT | CUTANEOUS | Status: AC
  Filled 2018-10-07: qty 5

## 2018-10-07 MED ORDER — LIDOCAINE HCL (CARDIAC) PF 100 MG/5ML IV SOSY
PREFILLED_SYRINGE | INTRAVENOUS | Status: DC | PRN
  Administered 2018-10-07: 60 mg via INTRAVENOUS

## 2018-10-07 MED ORDER — PROMETHAZINE HCL 25 MG/ML IJ SOLN
6.2500 mg | INTRAMUSCULAR | Status: DC | PRN
  Filled 2018-10-07: qty 1

## 2018-10-07 MED ORDER — LACTATED RINGERS IV SOLN
INTRAVENOUS | Status: DC
  Administered 2018-10-07 (×2): via INTRAVENOUS
  Filled 2018-10-07: qty 1000

## 2018-10-07 MED ORDER — OXYCODONE HCL 5 MG PO TABS
5.0000 mg | ORAL_TABLET | Freq: Once | ORAL | Status: AC
  Administered 2018-10-07: 5 mg via ORAL
  Filled 2018-10-07: qty 1

## 2018-10-07 MED ORDER — MIDAZOLAM HCL 2 MG/2ML IJ SOLN
INTRAMUSCULAR | Status: DC | PRN
  Administered 2018-10-07: 2 mg via INTRAVENOUS

## 2018-10-07 MED ORDER — CEPHALEXIN 500 MG PO CAPS: 500.0000 mg | ORAL_CAPSULE | Freq: Every day | ORAL | 0 refills | Status: DC

## 2018-10-07 MED ORDER — TAMSULOSIN HCL 0.4 MG PO CAPS: 0.4000 mg | ORAL_CAPSULE | Freq: Every day | ORAL | 0 refills | Status: AC

## 2018-10-07 MED ORDER — PROPOFOL 10 MG/ML IV BOLUS
INTRAVENOUS | Status: DC | PRN
  Administered 2018-10-07: 200 mg via INTRAVENOUS

## 2018-10-07 MED ORDER — OXYCODONE HCL 5 MG PO TABS
ORAL_TABLET | ORAL | Status: AC
  Filled 2018-10-07: qty 1

## 2018-10-07 MED ORDER — MIDAZOLAM HCL 2 MG/2ML IJ SOLN
INTRAMUSCULAR | Status: AC
  Filled 2018-10-07: qty 2

## 2018-10-07 MED ORDER — ONDANSETRON HCL 4 MG/2ML IJ SOLN
INTRAMUSCULAR | Status: AC
  Filled 2018-10-07: qty 2

## 2018-10-07 SURGICAL SUPPLY — 23 items
BAG DRAIN URO-CYSTO SKYTR STRL (DRAIN) ×2 IMPLANT
BAG DRN UROCATH (DRAIN) ×1
BAG URINE DRAINAGE (UROLOGICAL SUPPLIES) ×1 IMPLANT
BAG URINE LEG 500ML (DRAIN) ×1 IMPLANT
CATH COUDE FOLEY 2W 5CC 18FR (CATHETERS) IMPLANT
CATH FOLEY 2WAY SLVR  5CC 18FR (CATHETERS) ×1
CATH FOLEY 2WAY SLVR 5CC 18FR (CATHETERS) IMPLANT
CATH FOLEY 3WAY 30CC 22F (CATHETERS) IMPLANT
CLOTH BEACON ORANGE TIMEOUT ST (SAFETY) ×2 IMPLANT
FIBER LASER FLEXIVA 1000 (UROLOGICAL SUPPLIES) ×1 IMPLANT
GLOVE BIO SURGEON STRL SZ7.5 (GLOVE) ×2 IMPLANT
GOWN STRL REUS W/TWL XL LVL3 (GOWN DISPOSABLE) ×2 IMPLANT
HOLDER FOLEY CATH W/STRAP (MISCELLANEOUS) ×1 IMPLANT
IV NS IRRIG 3000ML ARTHROMATIC (IV SOLUTION) ×2 IMPLANT
KIT TURNOVER CYSTO (KITS) ×2 IMPLANT
LASER REVOLIX PROCEDURE (MISCELLANEOUS) ×2 IMPLANT
LOOP CUT BIPOLAR 24F LRG (ELECTROSURGICAL) IMPLANT
MANIFOLD NEPTUNE II (INSTRUMENTS) ×2 IMPLANT
PACK CYSTO (CUSTOM PROCEDURE TRAY) ×2 IMPLANT
SYR 30ML LL (SYRINGE) IMPLANT
SYRINGE IRR TOOMEY STRL 70CC (SYRINGE) ×1 IMPLANT
TUBE CONNECTING 12X1/4 (SUCTIONS) IMPLANT
TUBING UROLOGY SET (TUBING) ×1 IMPLANT

## 2018-10-07 NOTE — Anesthesia Procedure Notes (Signed)
Procedure Name: LMA Insertion Date/Time: 10/07/2018 12:53 PM Performed by: Raenette Rover, CRNA Pre-anesthesia Checklist: Patient identified, Emergency Drugs available, Suction available and Patient being monitored Patient Re-evaluated:Patient Re-evaluated prior to induction Oxygen Delivery Method: Circle system utilized Preoxygenation: Pre-oxygenation with 100% oxygen Induction Type: IV induction LMA: LMA inserted LMA Size: 4.0 Number of attempts: 1 Placement Confirmation: positive ETCO2,  CO2 detector and breath sounds checked- equal and bilateral Tube secured with: Tape Dental Injury: Teeth and Oropharynx as per pre-operative assessment

## 2018-10-07 NOTE — Op Note (Signed)
Preoperative diagnosis: Bladder stone, BPH with lower urinary tract symptoms, weak stream Postoperative diagnosis: Same  Procedure: Cystoscopy, holmium laser cystolitholopaxy 3 cm, thulium laser vaporization of the prostate  Surgeon: Junious Silk  Anesthesia: General  Indication for procedure: 58 year old white male with BPH and weak stream status post UroLift about a year ago but he continued to have obstructive symptoms.  He was noted to have a high bladder neck at the UroLift procedure and possibly need a second procedure.  He also was found to have a stone in the bladder on office cystoscopy.  Findings: On cystoscopy the urethra was unremarkable, the prostate was obstructed mainly by a small median lobe and high bladder neck.  The trigone and ureteral orifice ease were in the normal orthotopic position with clear reflux.  There was mild trabeculation of the bladder and a small diverticulum left posterior.  There was a stone that was about 3 cm adherent to the right bladder neck and it contained the UroLift capsular tab from the right side.  During vaporization, the urethral end piece was located on the right and one was located on the left and these were also removed.  He had an excellent channel at the end of the procedure.  Description of procedure: After consent was obtained patient brought to the operating room.  After adequate anesthesia he was placed in lithotomy position and prepped and draped in the usual sterile fashion.  A timeout was performed to confirm the patient and procedure.  The continuous flow laser sheath was passed per urethra into the bladder and the bladder inspected.  The stone was noted on the right side of the bladder and I used the scope to push it into the bladder.  I then inspected the bladder and bladder neck with the 70 degree lens and noted there to be no trauma or evidence of UroLift suture.  I then replaced the 30 degree lens and tried to use the thulium laser for the  stone but it was not fragmenting it well.  The holmium fiber was placed, thousand micron, and at a setting of 0.4 and 50 and 1 and 10 the stone was easily fragmented.  In the middle of the stone was the capsular tab from the right side.  This was irrigated out as well as all the stone fragments.  The holmium laser fiber was removed and the thulium fiber placed and I started at the 5 o'clock position after identifying the ureteral orifice ease and made decision on that left side and brought it down to the verumontanum.  Similarly on the right side an incision was made and this was brought to the verumontanum.  Some median lobe tissue was vaporized and then a short right lateral lobe and then left lateral lobe was vaporized.  This created an excellent channel and hemostasis was excellent at low pressure.  Again no vaporization was carried out distal to the verumontanum and the bladder neck and trigone and ureteral orifice ease all were intact.  I noted no other stones or foreign bodies in the bladder.  The scope was removed and an 89 French Foley catheter placed without difficulty.  It was left to gravity drainage.  Irrigation was clear.  He was awakened and taken to the recovery room in stable condition.  Complications: None  Blood loss: Minimal  Specimens: Stone fragments to office lab  Drains: 18 French Foley catheter  Disposition: Patient stable to PACU

## 2018-10-07 NOTE — Discharge Instructions (Signed)
Indwelling Urinary Catheter Care, Adult An indwelling urinary catheter is a thin tube that is put into your bladder. The tube helps to drain pee (urine) out of your body. The tube goes in through your urethra. Your urethra is where pee comes out of your body. Your pee will come out through the catheter, then it will go into a bag (drainage bag). Take good care of your catheter so it will work well. How to wear your catheter and bag Supplies needed  Sticky tape (adhesive tape) or a leg strap.  Alcohol wipe or soap and water (if you use tape).  A clean towel (if you use tape).  Large overnight bag.  Smaller bag (leg bag). Wearing your catheter Attach your catheter to your leg with tape or a leg strap.  Make sure the catheter is not pulled tight.  If a leg strap gets wet, take it off and put on a dry strap.  If you use tape to hold the bag on your leg: 1. Use an alcohol wipe or soap and water to wash your skin where the tape made it sticky before. 2. Use a clean towel to pat-dry that skin. 3. Use new tape to make the bag stay on your leg. Wearing your bags You should have been given a large overnight bag.  You may wear the overnight bag in the day or night.  Always have the overnight bag lower than your bladder.  Do not let the bag touch the floor.  Before you go to sleep, put a clean plastic bag in a wastebasket. Then hang the overnight bag inside the wastebasket. You should also have a smaller leg bag that fits under your clothes.  Always wear the leg bag below your knee.  Do not wear your leg bag at night. How to care for your skin and catheter Supplies needed  A clean washcloth.  Water and mild soap.  A clean towel. Caring for your skin and catheter      Clean the skin around your catheter every day: ? Wash your hands with soap and water. ? Wet a clean washcloth in warm water and mild soap. ? Clean the skin around your urethra. ? If you are male: ? Gently  spread the folds of skin around your vagina (labia). ? With the washcloth in your other hand, wipe the inner side of your labia on each side. Wipe from front to back. ? If you are male: ? Pull back any skin that covers the end of your penis (foreskin). ? With the washcloth in your other hand, wipe your penis in small circles. Start wiping at the tip of your penis, then move away from the catheter. ? With your free hand, hold the catheter close to where it goes into your body. ? Keep holding the catheter during cleaning so it does not get pulled out. ? With the washcloth in your other hand, clean the catheter. ? Only wipe downward on the catheter. ? Do not wipe upward toward your body. Doing this may push germs into your urethra and cause infection. ? Use a clean towel to pat-dry the catheter and the skin around it. Make sure to wipe off all soap. ? Wash your hands with soap and water.  Shower every day. Do not take baths.  Do not use cream, ointment, or lotion on the area where the catheter goes into your body, unless your doctor tells you to.  Do not use powders, sprays, or lotions  on your genital area.  Check your skin around the catheter every day for signs of infection. Check for: ? Redness, swelling, or pain. ? Fluid or blood. ? Warmth. ? Pus or a bad smell. How to empty the bag Supplies needed  Rubbing alcohol.  Gauze pad or cotton ball.  Tape or a leg strap. Emptying the bag Pour the pee out of your bag when it is ?- full, or at least 2-3 times a day. Do this for your overnight bag and your leg bag. 1. Wash your hands with soap and water. 2. Separate (detach) the bag from your leg. 3. Hold the bag over the toilet or a clean pail. Keep the bag lower than your hips and bladder. This is so the pee (urine) does not go back into the tube. 4. Open the pour spout. It is at the bottom of the bag. 5. Empty the pee into the toilet or pail. Do not let the pour spout touch any  surface. 6. Put rubbing alcohol on a gauze pad or cotton ball. 7. Use the gauze pad or cotton ball to clean the pour spout. 8. Close the pour spout. 9. Attach the bag to your leg with tape or a leg strap. 10. Wash your hands with soap and water. Follow instructions for cleaning the drainage bag:  From the product maker.  As told by your doctor. How to change the bag Supplies needed  Alcohol wipes.  A clean bag.  Tape or a leg strap. Changing the bag Replace your bag with a clean bag once a month. If it starts to leak, smell bad, or look dirty, change it sooner. 1. Wash your hands with soap and water. 2. Separate the dirty bag from your leg. 3. Pinch the catheter with your fingers so that pee does not spill out. 4. Separate the catheter tube from the bag tube where these tubes connect (at the connection valve). Do not let the tubes touch any surface. 5. Clean the end of the catheter tube with an alcohol wipe. Use a different alcohol wipe to clean the end of the bag tube. 6. Connect the catheter tube to the tube of the clean bag. 7. Attach the clean bag to your leg with tape or a leg strap. Do not make the bag tight on your leg. 8. Wash your hands with soap and water. General rules   Never pull on your catheter. Never try to take it out. Doing that can hurt you.  Always wash your hands before and after you touch your catheter or bag. Use a mild, fragrance-free soap. If you do not have soap and water, use hand sanitizer.  Always make sure there are no twists or bends (kinks) in the catheter tube.  Always make sure there are no leaks in the catheter or bag.  Drink enough fluid to keep your pee pale yellow.  Do not take baths, swim, or use a hot tub.  If you are male, wipe from front to back after you poop (have a bowel movement). Contact a doctor if:  Your pee is cloudy.  Your pee smells worse than usual.  Your catheter gets clogged.  Your catheter leaks.  Your  bladder feels full. Get help right away if:  You have redness, swelling, or pain where the catheter goes into your body.  You have fluid, blood, pus, or a bad smell coming from the area where the catheter goes into your body.  Your skin feels warm  where the catheter goes into your body.  You have a fever.  You have pain in your: ? Belly (abdomen). ? Legs. ? Lower back. ? Bladder.  You see blood in the catheter.  Your pee is pink or red.  You feel sick to your stomach (nauseous).  You throw up (vomit).  You have chills.  Your pee is not draining into the bag.  Your catheter gets pulled out. Summary  An indwelling urinary catheter is a thin tube that is placed into the bladder to help drain pee (urine) out of the body.  The catheter is placed into the part of the body that drains pee from the bladder (urethra).  Taking good care of your catheter will keep it working properly and help prevent problems.  Always wash your hands before and after touching your catheter or bag.  Never pull on your catheter or try to take it out. This information is not intended to replace advice given to you by your health care provider. Make sure you discuss any questions you have with your health care provider. Document Released: 11/17/2012 Document Revised: 01/13/2018 Document Reviewed: 03/08/2017 Elsevier Interactive Patient Education  2019 [facility], Care After  This sheet gives you information about how to care for yourself after your procedure. Your health care provider may also give you more specific instructions. If you have problems or questions, contact your health care provider. What can I expect after the procedure? For the first few weeks after the procedure:  You will feel a need to urinate often.  You may have blood in your urine.  You may feel a sudden need to urinate. Once your urinary catheter is removed, you may have a burning feeling  when you urinate, especially at the end of urination. This feeling usually passes within 3-5 days. Follow these instructions at home: Activity  Return to your normal activities as told by your health care provider. Ask your health care provider what activities are safe for you.  Do not do vigorous exercise for 1 week or as told by your health care provider.  Do not lift anything that is heavier than 10 lb (4.5 kg) until your health care provider say it is safe.  Avoid sexual activity for 4-6 weeks or as told by your health care provider.  Do not ride in a car for extended periods of time for 1 month or as told by your health care provider.  Do not drive for 24 hours if you were given a medicine to help you relax (sedative). Diet  Eat foods that are high in fiber, such as fresh fruits and vegetables, whole grains, and beans.  Drink enough fluid to keep your urine clear or pale yellow. Medicines  Take over-the-counter and prescription medicines, including stool softeners, only as told by your health care provider.  If you were prescribed an antibiotic medicine, take it as told by your health care provider. Do not stop taking the antibiotic even if you start to feel better. General instructions   If you were given elastic support stockings, wear them as told by your health care provider.  Do not strain to have a bowel movement. Straining may lead to bleeding from the prostate and cause clots to form and cause trouble urinating.  Keep all follow-up visits as told by your health care provider. This is important. Contact a health care provider if:  You have a fever or chills.  You have spasms  or pain with the urinary catheter still in place.  Once the catheter has been removed, you experience difficulty starting your stream when attempting to urinate. Get help right away if:  There is a blockage in your catheter.  Your catheter has been removed and you are suddenly unable to  urinate.  Your urine smells unusually bad.  You start to have blood clots in your urine.  The blood in your urine becomes persistent or gets thick.  You develop chest pains.  You develop shortness of breath.  You develop swelling or pain in your leg. Summary  You may notice urinary symptoms for a few weeks after your procedure.  Follow instructions from your health care provider regarding activity restrictions such as lifting, exercise, and sexual activity.  Contact your health care provider if you have any unusual symptoms during your recovery. This information is not intended to replace advice given to you by your health care provider. Make sure you discuss any questions you have with your health care provider. Document Released: 07/23/2005 Document Revised: 03/09/2016 Document Reviewed: 03/09/2016 Elsevier Interactive Patient Education  2019 [place] [facility] Instructions  Activity: Get plenty of rest for the remainder of the day. A responsible individual must stay with you for 24 hours following the procedure.  For the next 24 hours, DO NOT: -Drive a car -Paediatric nurse -Drink alcoholic beverages -Take any medication unless instructed by your physician -Make any legal decisions or sign important papers.  Meals: Start with liquid foods such as gelatin or soup. Progress to regular foods as tolerated. Avoid greasy, spicy, heavy foods. If nausea and/or vomiting occur, drink only clear liquids until the nausea and/or vomiting subsides. Call your physician if vomiting continues.  Special Instructions/Symptoms: Your throat may feel dry or sore from the anesthesia or the breathing tube placed in your throat during surgery. If this causes discomfort, gargle with warm salt water. The discomfort should disappear within 24 hours.  If you had a scopolamine patch placed behind your ear for the management of post- operative nausea and/or vomiting:  1.  The medication in the patch is effective for 72 hours, after which it should be removed.  Wrap patch in a tissue and discard in the trash. Wash hands thoroughly with soap and water. 2. You may remove the patch earlier than 72 hours if you experience unpleasant side effects which may include dry mouth, dizziness or visual disturbances. 3. Avoid touching the patch. Wash your hands with soap and water after contact with the patch.

## 2018-10-07 NOTE — Transfer of Care (Signed)
Immediate Anesthesia Transfer of Care Note  Patient: Chase Sanchez  Procedure(s) Performed: Procedure(s) (LRB): THULIUM LASER TURP (TRANSURETHRAL RESECTION OF PROSTATE), HOLMIUM LASER LITHOPAXY (N/A)  Patient Location: PACU  Anesthesia Type: General  Level of Consciousness: awake, oriented, sedated and patient cooperative  Airway & Oxygen Therapy: Patient Spontanous Breathing and Patient connected to face mask oxygen  Post-op Assessment: Report given to PACU RN and Post -op Vital signs reviewed and stable  Post vital signs: Reviewed and stable  Complications: No apparent anesthesia complications Last Vitals:  Vitals Value Taken Time  BP 153/90 10/07/2018  2:30 PM  Temp    Pulse 78 10/07/2018  2:31 PM  Resp 12 10/07/2018  2:31 PM  SpO2 97 % 10/07/2018  2:31 PM  Vitals shown include unvalidated device data.  Last Pain:  Vitals:   10/07/18 1124  TempSrc: Oral

## 2018-10-07 NOTE — Anesthesia Preprocedure Evaluation (Signed)
Anesthesia Evaluation  Patient identified by MRN, date of birth, ID band Patient awake    Reviewed: Allergy & Precautions, NPO status , Patient's Chart, lab work & pertinent test results  History of Anesthesia Complications (+) PONV  Airway Mallampati: II  TM Distance: >3 FB Neck ROM: Full    Dental no notable dental hx.    Pulmonary COPD, Current Smoker,    Pulmonary exam normal breath sounds clear to auscultation       Cardiovascular negative cardio ROS Normal cardiovascular exam Rhythm:Regular Rate:Normal     Neuro/Psych negative neurological ROS  negative psych ROS   GI/Hepatic negative GI ROS, Neg liver ROS,   Endo/Other  negative endocrine ROSdiabetes  Renal/GU negative Renal ROS  negative genitourinary   Musculoskeletal negative musculoskeletal ROS (+)   Abdominal   Peds negative pediatric ROS (+)  Hematology negative hematology ROS (+)   Anesthesia Other Findings   Reproductive/Obstetrics negative OB ROS                             Anesthesia Physical Anesthesia Plan  ASA: II  Anesthesia Plan: General   Post-op Pain Management:    Induction: Intravenous  PONV Risk Score and Plan: 2 and Ondansetron, Dexamethasone and Treatment may vary due to age or medical condition  Airway Management Planned: LMA  Additional Equipment:   Intra-op Plan:   Post-operative Plan: Extubation in OR  Informed Consent: I have reviewed the patients History and Physical, chart, labs and discussed the procedure including the risks, benefits and alternatives for the proposed anesthesia with the patient or authorized representative who has indicated his/her understanding and acceptance.     Dental advisory given  Plan Discussed with: CRNA and Surgeon  Anesthesia Plan Comments:         Anesthesia Quick Evaluation

## 2018-10-07 NOTE — Interval H&P Note (Signed)
History and Physical Interval Note:  10/07/2018 12:36 PM  Chase Sanchez  has presented today for surgery, with the diagnosis of BLADDER STONE, BENIGN PROSTATIC HYPERPLASIA  The various methods of treatment have been discussed with the patient and family. I discussed again with the patient and his daughter the nature, potential benefits, risks and alternatives to laser vaporization of prostate and bladder stone removal, including side effects of the proposed treatment, the likelihood of the patient achieving the goals of the procedure, and any potential problems that might occur during the procedure or recuperation. He has consented to  Procedure(s): THULIUM LASER TURP (TRANSURETHRAL RESECTION OF PROSTATE) (N/A) as a surgical intervention . We discussed risk of stricture, incontinence and retrograde ejaculation among others.  The patient's history has been reviewed, patient examined, no change in status, stable for surgery.  I have reviewed the patient's chart and labs. He mentioned if he misses a dose or two of tamsulosin, he has a terribly weal stream. Questions were answered to the patient's satisfaction.     Festus Aloe

## 2018-10-08 ENCOUNTER — Encounter (HOSPITAL_BASED_OUTPATIENT_CLINIC_OR_DEPARTMENT_OTHER): Payer: Self-pay | Admitting: Urology

## 2018-10-08 NOTE — Anesthesia Postprocedure Evaluation (Signed)
Anesthesia Post Note  Patient: SKANDA WORLDS  Procedure(s) Performed: THULIUM LASER TURP (TRANSURETHRAL RESECTION OF PROSTATE), HOLMIUM LASER LITHOPAXY (N/A )     Patient location during evaluation: PACU Anesthesia Type: General Level of consciousness: awake and alert Pain management: pain level controlled Vital Signs Assessment: post-procedure vital signs reviewed and stable Respiratory status: spontaneous breathing, nonlabored ventilation, respiratory function stable and patient connected to nasal cannula oxygen Cardiovascular status: blood pressure returned to baseline and stable Postop Assessment: no apparent nausea or vomiting Anesthetic complications: no    Last Vitals:  Vitals:   10/07/18 1515 10/07/18 1625  BP: 110/79 126/79  Pulse: 72 79  Resp: 16 14  Temp:  36.6 C  SpO2: 96% 96%    Last Pain:  Vitals:   10/07/18 1124  TempSrc: Oral                 Neymar Dowe S

## 2019-05-08 ENCOUNTER — Other Ambulatory Visit: Payer: Self-pay | Admitting: Neurosurgery

## 2019-05-15 ENCOUNTER — Other Ambulatory Visit: Payer: Self-pay | Admitting: Neurosurgery

## 2019-06-09 NOTE — Pre-Procedure Instructions (Signed)
[facility] - [place], [place] - [address] [place] [address] Phone: [phone] Fax: [phone]      Your procedure is scheduled on 06-15-19  Report to [facility] Main Entrance "A" at 0530 A.M., and check in at the Admitting office.  Call this number if you have problems the morning of surgery:  [phone]  Call [phone] if you have any questions prior to your surgery date Monday-Friday 8am-4pm    Remember:  Do not eat or drink after midnight the night before your surgery  Take these medicines the morning of surgery with A SIP OF WATER :   cyclobenzaprine (FLEXERIL)as needed gabapentin (NEURONTIN)as needed  Follow your surgeon's instructions on when to stop Aspirin.  If no instructions were given by your surgeon then you will need to call the office to get those instructions.   7 days prior to surgery STOP taking any Aspirin (unless otherwise instructed by your surgeon), Aleve, Naproxen, Ibuprofen, Motrin, Advil, Goody's, BC's, all herbal medications, fish oil, and all vitamins.    The Morning of Surgery  Do not wear jewelry, make-up or nail polish.  Do not wear lotions, powders, or perfumes/colognes, or deodorant  Do not shave 48 hours prior to surgery.  Men may shave face and neck.  Do not bring valuables to the hospital.  [facility] is not responsible for any belongings or valuables.  If you are a smoker, DO NOT Smoke 24 hours prior to surgery IF you wear a CPAP at night please bring your mask, tubing, and machine the morning of surgery    WHAT DO I DO ABOUT MY DIABETES MEDICATION?   Marland Kitchen Do not take oral diabetes medicines (pills)including Metformin , Glimepiride (AMARYL), PRANDIN  the morning of surgery.   How to Manage Your Diabetes Before and After Surgery  Why is it important to control my blood sugar before and after surgery? . Improving blood sugar levels before and after surgery  helps healing and can limit problems. . A way of improving blood sugar control is eating a healthy diet by: o  Eating less sugar and carbohydrates o  Increasing activity/exercise o  Talking with your doctor about reaching your blood sugar goals . High blood sugars (greater than 180 mg/dL) can raise your risk of infections and slow your recovery, so you will need to focus on controlling your diabetes during the weeks before surgery. . Make sure that the doctor who takes care of your diabetes knows about your planned surgery including the date and location.  How do I manage my blood sugar before surgery? . Check your blood sugar at least 4 times a day, starting 2 days before surgery, to make sure that the level is not too high or low. o Check your blood sugar the morning of your surgery when you wake up and every 2 hours until you get to the Short Stay unit. . If your blood sugar is less than 70 mg/dL, you will need to treat for low blood sugar: o Do not take insulin. o Treat a low blood sugar (less than 70 mg/dL) with  cup of clear juice (cranberry or apple), 4 glucose tablets, OR glucose gel. o Recheck blood sugar in 15 minutes after treatment (to make sure it is greater than 70 mg/dL). If your blood sugar is not greater than 70 mg/dL on recheck, call [phone] for further instructions. . Report your blood sugar to the short stay  nurse when you get to Short Stay.  . If you are admitted to the hospital after surgery: o Your blood sugar will be checked by the staff and you will probably be given insulin after surgery (instead of oral diabetes medicines) to make sure you have good blood sugar levels. o The goal for blood sugar control after surgery is 80-180 mg/dL.   Remember that you must have someone to transport you home after your surgery, and remain with you for 24 hours if you are discharged the same day.   Contacts, glasses, hearing aids, dentures or bridgework may not be worn into  surgery.    Leave your suitcase in the car.  After surgery it may be brought to your room.  For patients admitted to the hospital, discharge time will be determined by your treatment team.  Patients discharged the day of surgery will not be allowed to drive home.    Special instructions:   [facility]- Preparing For Surgery  Before surgery, you can play an important role. Because skin is not sterile, your skin needs to be as free of germs as possible. You can reduce the number of germs on your skin by washing with CHG (chlorahexidine gluconate) Soap before surgery.  CHG is an antiseptic cleaner which kills germs and bonds with the skin to continue killing germs even after washing.    Oral Hygiene is also important to reduce your risk of infection.  Remember - BRUSH YOUR TEETH THE MORNING OF SURGERY WITH YOUR REGULAR TOOTHPASTE  Please do not use if you have an allergy to CHG or antibacterial soaps. If your skin becomes reddened/irritated stop using the CHG.  Do not shave (including legs and underarms) for at least 48 hours prior to first CHG shower. It is OK to shave your face.  Please follow these instructions carefully.   1. Shower the NIGHT BEFORE SURGERY and the MORNING OF SURGERY with CHG Soap.   2. If you chose to wash your hair, wash your hair first as usual with your normal shampoo.  3. After you shampoo, rinse your hair and body thoroughly to remove the shampoo.  4. Use CHG as you would any other liquid soap. You can apply CHG directly to the skin and wash gently with a scrungie or a clean washcloth.   5. Apply the CHG Soap to your body ONLY FROM THE NECK DOWN.  Do not use on open wounds or open sores. Avoid contact with your eyes, ears, mouth and genitals (private parts). Wash Face and genitals (private parts)  with your normal soap.   6. Wash thoroughly, paying special attention to the area where your surgery will be performed.  7. Thoroughly rinse your body with warm  water from the neck down.  8. DO NOT shower/wash with your normal soap after using and rinsing off the CHG Soap.  9. Pat yourself dry with a CLEAN TOWEL.  10. Wear CLEAN PAJAMAS to bed the night before surgery, wear comfortable clothes the morning of surgery  11. Place CLEAN SHEETS on your bed the night of your first shower and DO NOT SLEEP WITH PETS.    Day of Surgery:  Do not apply any deodorants/lotions. Please shower the morning of surgery with the CHG soap  Please wear clean clothes to the hospital/surgery center.   Remember to brush your teeth WITH YOUR REGULAR TOOTHPASTE.   Please read over the  fact sheets that you were given.

## 2019-06-10 ENCOUNTER — Encounter (HOSPITAL_COMMUNITY)
Admission: RE | Admit: 2019-06-10 | Discharge: 2019-06-10 | Disposition: A | Discharge status: Home / Self Care | Payer: BC Managed Care – PPO | Source: Ambulatory Visit | Attending: Neurosurgery | Admitting: Neurosurgery

## 2019-06-10 ENCOUNTER — Other Ambulatory Visit: Payer: Self-pay

## 2019-06-10 ENCOUNTER — Encounter (HOSPITAL_COMMUNITY): Payer: Self-pay

## 2019-06-10 DIAGNOSIS — I2510 Atherosclerotic heart disease of native coronary artery without angina pectoris: Secondary | ICD-10-CM | POA: Insufficient documentation

## 2019-06-10 DIAGNOSIS — Z87891 Personal history of nicotine dependence: Secondary | ICD-10-CM | POA: Diagnosis not present

## 2019-06-10 DIAGNOSIS — Z01818 Encounter for other preprocedural examination: Secondary | ICD-10-CM | POA: Insufficient documentation

## 2019-06-10 LAB — BASIC METABOLIC PANEL
Anion gap: 10 (ref 5–15)
BUN: 12 mg/dL (ref 6–20)
CO2: 21 mmol/L — ABNORMAL LOW (ref 22–32)
Calcium: 9.1 mg/dL (ref 8.9–10.3)
Chloride: 104 mmol/L (ref 98–111)
Creatinine, Ser: 0.76 mg/dL (ref 0.61–1.24)
GFR calc Af Amer: >60 mL/min (ref >60)
GFR calc non Af Amer: >60 mL/min (ref >60)
Glucose, Bld: 147 mg/dL — ABNORMAL HIGH (ref 70–99)
Potassium: 4.3 mmol/L (ref 3.5–5.1)
Sodium: 135 mmol/L (ref 135–145)

## 2019-06-10 LAB — SURGICAL PCR SCREEN
MRSA, PCR: NEGATIVE
Staphylococcus aureus: POSITIVE — AB

## 2019-06-10 LAB — HEMOGLOBIN A1C
Hgb A1c MFr Bld: 8.1 % — ABNORMAL HIGH (ref 4.8–5.6)
Mean Plasma Glucose: 185.77 mg/dL

## 2019-06-10 LAB — TYPE AND SCREEN
ABO/RH(D): O POS
Antibody Screen: NEGATIVE

## 2019-06-10 LAB — CBC
HCT: 50.8 % (ref 39.0–52.0)
Hemoglobin: 16.9 g/dL (ref 13.0–17.0)
MCH: 29 pg (ref 26.0–34.0)
MCHC: 33.3 g/dL (ref 30.0–36.0)
MCV: 87.1 fL (ref 80.0–100.0)
Platelets: 232 10*3/uL (ref 150–400)
RBC: 5.83 MIL/uL — ABNORMAL HIGH (ref 4.22–5.81)
RDW: 13.2 % (ref 11.5–15.5)
WBC: 12.4 10*3/uL — ABNORMAL HIGH (ref 4.0–10.5)
nRBC: 0 % (ref 0.0–0.2)

## 2019-06-10 LAB — GLUCOSE, CAPILLARY: Glucose-Capillary: 184 mg/dL — ABNORMAL HIGH (ref 70–99)

## 2019-06-10 NOTE — Progress Notes (Signed)
PCP - [name], MD Cardiologist - Nishan/Crenshaw  Chest x-ray - N/A EKG - 10/07/18 Stress Test - 04/20/2011 ECHO - N/A Cardiac Cath - 12/14/1999  Sleep Study - yes CPAP - wears nightly; settings at 4L  Fasting Blood Sugar - 160 Checks Blood Sugar _3_ times a day  Blood Thinner Instructions: N/A Aspirin Instructions: stopped 2 weeks ago per surgeon instructions  COVID TEST- scheduled 06/12/19; patient informed of need for self-quarantine; states verbal understanding  Anesthesia review: Yes; CAD hx; positive sleep apnea w/CPAP usage; last HgbA1C 9.1 - rechecking 06/10/19  Patient denies shortness of breath, fever, cough and chest pain at PAT appointment  All instructions explained to the patient, with a verbal understanding of the material. Patient agrees to go over the instructions while at home for a better understanding. Patient also instructed to self quarantine after being tested for COVID-19. The opportunity to ask questions was provided.

## 2019-06-11 NOTE — Anesthesia Preprocedure Evaluation (Addendum)
Anesthesia Evaluation  Patient identified by MRN, date of birth, ID band Patient awake    Reviewed: Allergy & Precautions, NPO status , Patient's Chart, lab work & pertinent test results  History of Anesthesia Complications (+) PONV and history of anesthetic complications  Airway Mallampati: II  TM Distance: >3 FB Neck ROM: Limited    Dental  (+) Dental Advisory Given, Teeth Intact   Pulmonary sleep apnea and Continuous Positive Airway Pressure Ventilation , COPD, Current Smoker and Patient abstained from smoking.,    Pulmonary exam normal        Cardiovascular + CAD  Normal cardiovascular exam     Neuro/Psych PSYCHIATRIC DISORDERS Anxiety  B/l carpal tunnel syndrome   Neuromuscular disease    GI/Hepatic Neg liver ROS, GERD  Medicated,  Endo/Other  diabetes, Type 2, Oral Hypoglycemic Agents  Renal/GU negative Renal ROS    BPH Hypogonadism     Musculoskeletal  (+) Arthritis ,   Abdominal   Peds  Hematology negative hematology ROS (+)   Anesthesia Other Findings   Reproductive/Obstetrics                           Anesthesia Physical Anesthesia Plan  ASA: III  Anesthesia Plan: General   Post-op Pain Management:    Induction: Intravenous  PONV Risk Score and Plan: 4 or greater and Treatment may vary due to age or medical condition, Ondansetron, Midazolam and Dexamethasone  Airway Management Planned: Oral ETT and Video Laryngoscope Planned  Additional Equipment: None  Intra-op Plan:   Post-operative Plan: Extubation in OR  Informed Consent: I have reviewed the patients History and Physical, chart, labs and discussed the procedure including the risks, benefits and alternatives for the proposed anesthesia with the patient or authorized representative who has indicated his/her understanding and acceptance.     Dental advisory given  Plan Discussed with: CRNA and  Anesthesiologist  Anesthesia Plan Comments:       Anesthesia Quick Evaluation

## 2019-06-11 NOTE — Progress Notes (Signed)
Anesthesia Chart Review: Cardiac eval in 2012 for dyspnea.  He had a normal nuclear stress and it was felt his symptoms were likely due to smoking history.  He had PFTs that showed mild obstruction.  He also has history of remote cath in 2001 that showed minimal CAD.  Preop labs reviewed, A1c 8.1 c/w pts DMII, otherwise unremarkable.   EKG 10/07/18: Normal sinus rhythm. Rate 80. Possible LAE. Incomplete right bundle branch block. No significant change since last tracing.  Nuclear stress test 04/20/11: Normal myovue study with no evidence of ischemia or infarction  Cardiac cath 12/14/99:  1. LM normal 2. LAD: 25% stenosis in the mid vessel. It gives rise to a large first diagonal and a small second diagonal. 3. LCX: dominant vessel. It gives rise to a small OM-1 and a large OM-2. OM-2 has a 25% stenosis at its origin. The LCX also gives rise to a normal sized posterolateral branch and a normal sized posterior descending artery. In the distal LCX at the origin of the posterior descending artery is a 25% stenosis. 4. RCA: The right coronary artery is a small nondominant vessel. It is free of angiographic disease.   Wynonia Musty [facility] Short Stay Center/Anesthesiology Phone [phone] 06/11/2019 10:50 AM

## 2019-06-12 ENCOUNTER — Other Ambulatory Visit (HOSPITAL_COMMUNITY)
Admission: RE | Admit: 2019-06-12 | Discharge: 2019-06-12 | Disposition: A | Discharge status: Home / Self Care | Payer: BC Managed Care – PPO | Source: Ambulatory Visit | Attending: Internal Medicine | Admitting: Internal Medicine

## 2019-06-12 DIAGNOSIS — Z01812 Encounter for preprocedural laboratory examination: Secondary | ICD-10-CM | POA: Diagnosis not present

## 2019-06-12 DIAGNOSIS — Z20828 Contact with and (suspected) exposure to other viral communicable diseases: Secondary | ICD-10-CM | POA: Insufficient documentation

## 2019-06-12 LAB — SARS CORONAVIRUS 2 (TAT 6-24 HRS): SARS Coronavirus 2: NEGATIVE

## 2019-06-15 ENCOUNTER — Ambulatory Visit (HOSPITAL_COMMUNITY): Payer: BC Managed Care – PPO | Admitting: Anesthesiology

## 2019-06-15 ENCOUNTER — Ambulatory Visit (HOSPITAL_COMMUNITY): Payer: BC Managed Care – PPO

## 2019-06-15 ENCOUNTER — Encounter (HOSPITAL_COMMUNITY)
Admission: RE | Disposition: A | Discharge status: Home / Self Care | Payer: Self-pay | Source: Home / Self Care | Attending: Neurosurgery

## 2019-06-15 ENCOUNTER — Observation Stay (HOSPITAL_COMMUNITY)
Admission: RE | Admit: 2019-06-15 | Discharge: 2019-06-16 | Disposition: A | Discharge status: Home / Self Care | Payer: BC Managed Care – PPO | Attending: Neurosurgery | Admitting: Neurosurgery

## 2019-06-15 ENCOUNTER — Other Ambulatory Visit: Payer: Self-pay

## 2019-06-15 ENCOUNTER — Encounter (HOSPITAL_COMMUNITY): Payer: Self-pay

## 2019-06-15 ENCOUNTER — Ambulatory Visit (HOSPITAL_COMMUNITY): Payer: BC Managed Care – PPO | Admitting: Physician Assistant

## 2019-06-15 DIAGNOSIS — Z419 Encounter for procedure for purposes other than remedying health state, unspecified: Secondary | ICD-10-CM

## 2019-06-15 DIAGNOSIS — M4722 Other spondylosis with radiculopathy, cervical region: Secondary | ICD-10-CM | POA: Diagnosis present

## 2019-06-15 DIAGNOSIS — Z6827 Body mass index (BMI) 27.0-27.9, adult: Secondary | ICD-10-CM | POA: Diagnosis not present

## 2019-06-15 DIAGNOSIS — M40202 Unspecified kyphosis, cervical region: Secondary | ICD-10-CM | POA: Diagnosis not present

## 2019-06-15 DIAGNOSIS — N400 Enlarged prostate without lower urinary tract symptoms: Secondary | ICD-10-CM | POA: Insufficient documentation

## 2019-06-15 DIAGNOSIS — J449 Chronic obstructive pulmonary disease, unspecified: Secondary | ICD-10-CM | POA: Insufficient documentation

## 2019-06-15 DIAGNOSIS — E669 Obesity, unspecified: Secondary | ICD-10-CM | POA: Diagnosis not present

## 2019-06-15 DIAGNOSIS — E119 Type 2 diabetes mellitus without complications: Secondary | ICD-10-CM | POA: Insufficient documentation

## 2019-06-15 DIAGNOSIS — Z9049 Acquired absence of other specified parts of digestive tract: Secondary | ICD-10-CM | POA: Diagnosis not present

## 2019-06-15 DIAGNOSIS — Z91041 Radiographic dye allergy status: Secondary | ICD-10-CM | POA: Insufficient documentation

## 2019-06-15 DIAGNOSIS — M1990 Unspecified osteoarthritis, unspecified site: Secondary | ICD-10-CM | POA: Diagnosis not present

## 2019-06-15 DIAGNOSIS — Z981 Arthrodesis status: Secondary | ICD-10-CM | POA: Diagnosis not present

## 2019-06-15 DIAGNOSIS — M5011 Cervical disc disorder with radiculopathy,  high cervical region: Principal | ICD-10-CM | POA: Insufficient documentation

## 2019-06-15 DIAGNOSIS — I2510 Atherosclerotic heart disease of native coronary artery without angina pectoris: Secondary | ICD-10-CM | POA: Diagnosis not present

## 2019-06-15 DIAGNOSIS — Z833 Family history of diabetes mellitus: Secondary | ICD-10-CM | POA: Insufficient documentation

## 2019-06-15 DIAGNOSIS — E785 Hyperlipidemia, unspecified: Secondary | ICD-10-CM | POA: Insufficient documentation

## 2019-06-15 DIAGNOSIS — Z885 Allergy status to narcotic agent status: Secondary | ICD-10-CM | POA: Insufficient documentation

## 2019-06-15 DIAGNOSIS — K219 Gastro-esophageal reflux disease without esophagitis: Secondary | ICD-10-CM | POA: Insufficient documentation

## 2019-06-15 DIAGNOSIS — Z7984 Long term (current) use of oral hypoglycemic drugs: Secondary | ICD-10-CM | POA: Insufficient documentation

## 2019-06-15 DIAGNOSIS — Z7982 Long term (current) use of aspirin: Secondary | ICD-10-CM | POA: Diagnosis not present

## 2019-06-15 DIAGNOSIS — F419 Anxiety disorder, unspecified: Secondary | ICD-10-CM | POA: Diagnosis not present

## 2019-06-15 DIAGNOSIS — F17210 Nicotine dependence, cigarettes, uncomplicated: Secondary | ICD-10-CM | POA: Diagnosis not present

## 2019-06-15 DIAGNOSIS — Z801 Family history of malignant neoplasm of trachea, bronchus and lung: Secondary | ICD-10-CM | POA: Insufficient documentation

## 2019-06-15 DIAGNOSIS — Z888 Allergy status to other drugs, medicaments and biological substances status: Secondary | ICD-10-CM | POA: Insufficient documentation

## 2019-06-15 DIAGNOSIS — M5020 Other cervical disc displacement, unspecified cervical region: Secondary | ICD-10-CM | POA: Diagnosis present

## 2019-06-15 DIAGNOSIS — M4802 Spinal stenosis, cervical region: Secondary | ICD-10-CM | POA: Insufficient documentation

## 2019-06-15 DIAGNOSIS — D126 Benign neoplasm of colon, unspecified: Secondary | ICD-10-CM | POA: Insufficient documentation

## 2019-06-15 DIAGNOSIS — I4510 Unspecified right bundle-branch block: Secondary | ICD-10-CM | POA: Insufficient documentation

## 2019-06-15 DIAGNOSIS — Z79899 Other long term (current) drug therapy: Secondary | ICD-10-CM | POA: Insufficient documentation

## 2019-06-15 DIAGNOSIS — G4733 Obstructive sleep apnea (adult) (pediatric): Secondary | ICD-10-CM | POA: Insufficient documentation

## 2019-06-15 DIAGNOSIS — Z8042 Family history of malignant neoplasm of prostate: Secondary | ICD-10-CM | POA: Insufficient documentation

## 2019-06-15 HISTORY — PX: ANTERIOR CERVICAL DECOMP/DISCECTOMY FUSION: SHX1161

## 2019-06-15 LAB — GLUCOSE, CAPILLARY
Glucose-Capillary: 144 mg/dL — ABNORMAL HIGH (ref 70–99)
Glucose-Capillary: 222 mg/dL — ABNORMAL HIGH (ref 70–99)
Glucose-Capillary: 195 mg/dL — ABNORMAL HIGH (ref 70–99)
Glucose-Capillary: 180 mg/dL — ABNORMAL HIGH (ref 70–99)

## 2019-06-15 SURGERY — ANTERIOR CERVICAL DECOMPRESSION/DISCECTOMY FUSION 2 LEVEL/HARDWARE REMOVAL
Anesthesia: General | Site: Spine Cervical

## 2019-06-15 MED ORDER — GABAPENTIN 300 MG PO CAPS
300.0000 mg | ORAL_CAPSULE | Freq: Every day | ORAL | Status: DC | PRN
  Administered 2019-06-15: 14:00:00 300 mg via ORAL
  Filled 2019-06-15: qty 1

## 2019-06-15 MED ORDER — CHLORHEXIDINE GLUCONATE CLOTH 2 % EX PADS: 6.0000 | MEDICATED_PAD | Freq: Once | CUTANEOUS | Status: DC

## 2019-06-15 MED ORDER — THROMBIN 5000 UNITS EX SOLR
CUTANEOUS | Status: AC
  Filled 2019-06-15: qty 5000

## 2019-06-15 MED ORDER — MORPHINE SULFATE (PF) 4 MG/ML IV SOLN: 4.0000 mg | INTRAVENOUS | Status: DC | PRN

## 2019-06-15 MED ORDER — MIDAZOLAM HCL 2 MG/2ML IJ SOLN
INTRAMUSCULAR | Status: AC
  Filled 2019-06-15: qty 2

## 2019-06-15 MED ORDER — INSULIN ASPART 100 UNIT/ML ~~LOC~~ SOLN
SUBCUTANEOUS | Status: AC
  Filled 2019-06-15: qty 1

## 2019-06-15 MED ORDER — METFORMIN HCL 500 MG PO TABS
1000.0000 mg | ORAL_TABLET | Freq: Two times a day (BID) | ORAL | Status: DC
  Administered 2019-06-15 – 2019-06-16 (×2): 1000 mg via ORAL
  Filled 2019-06-15 (×2): qty 2

## 2019-06-15 MED ORDER — POTASSIUM CHLORIDE IN NACL 20-0.9 MEQ/L-% IV SOLN: INTRAVENOUS | Status: DC

## 2019-06-15 MED ORDER — TAMSULOSIN HCL 0.4 MG PO CAPS
0.8000 mg | ORAL_CAPSULE | Freq: Every day | ORAL | Status: DC
  Administered 2019-06-15: 17:00:00 0.8 mg via ORAL
  Filled 2019-06-15: qty 2

## 2019-06-15 MED ORDER — SODIUM CHLORIDE 0.9% FLUSH
3.0000 mL | Freq: Two times a day (BID) | INTRAVENOUS | Status: DC
  Administered 2019-06-15 (×2): 3 mL via INTRAVENOUS

## 2019-06-15 MED ORDER — INSULIN ASPART 100 UNIT/ML ~~LOC~~ SOLN: 0.0000 [IU] | Freq: Every day | SUBCUTANEOUS | Status: DC

## 2019-06-15 MED ORDER — FENTANYL CITRATE (PF) 100 MCG/2ML IJ SOLN
25.0000 ug | INTRAMUSCULAR | Status: DC | PRN
  Administered 2019-06-15 (×2): 50 ug via INTRAVENOUS

## 2019-06-15 MED ORDER — SEMAGLUTIDE (1 MG/DOSE) 2 MG/1.5ML ~~LOC~~ SOPN: 1.0000 mg | PEN_INJECTOR | SUBCUTANEOUS | Status: DC

## 2019-06-15 MED ORDER — ALUM & MAG HYDROXIDE-SIMETH 200-200-20 MG/5ML PO SUSP
30.0000 mL | Freq: Four times a day (QID) | ORAL | Status: DC | PRN
  Administered 2019-06-16: 03:00:00 30 mL via ORAL

## 2019-06-15 MED ORDER — PROMETHAZINE HCL 25 MG/ML IJ SOLN: 6.2500 mg | INTRAMUSCULAR | Status: DC | PRN

## 2019-06-15 MED ORDER — ACETAMINOPHEN 10 MG/ML IV SOLN
INTRAVENOUS | Status: DC | PRN
  Administered 2019-06-15: 1000 mg via INTRAVENOUS

## 2019-06-15 MED ORDER — MIDAZOLAM HCL 5 MG/5ML IJ SOLN
INTRAMUSCULAR | Status: DC | PRN
  Administered 2019-06-15: 2 mg via INTRAVENOUS

## 2019-06-15 MED ORDER — KETOROLAC TROMETHAMINE 30 MG/ML IJ SOLN
30.0000 mg | Freq: Four times a day (QID) | INTRAMUSCULAR | Status: DC
  Administered 2019-06-15 – 2019-06-16 (×3): 30 mg via INTRAVENOUS
  Filled 2019-06-15 (×3): qty 1

## 2019-06-15 MED ORDER — OXYCODONE HCL 5 MG/5ML PO SOLN: 5.0000 mg | Freq: Once | ORAL | Status: DC | PRN

## 2019-06-15 MED ORDER — LIDOCAINE-EPINEPHRINE 1 %-1:100000 IJ SOLN
INTRAMUSCULAR | Status: DC | PRN
  Administered 2019-06-15: 10 mL

## 2019-06-15 MED ORDER — GLIMEPIRIDE 4 MG PO TABS
4.0000 mg | ORAL_TABLET | Freq: Every day | ORAL | Status: DC
  Administered 2019-06-16: 08:00:00 4 mg via ORAL
  Filled 2019-06-15: qty 2
  Filled 2019-06-15: qty 1

## 2019-06-15 MED ORDER — THROMBIN 20000 UNITS EX SOLR
CUTANEOUS | Status: DC | PRN
  Administered 2019-06-15: 20 mL via TOPICAL

## 2019-06-15 MED ORDER — MENTHOL 3 MG MT LOZG: 1.0000 | LOZENGE | OROMUCOSAL | Status: DC | PRN

## 2019-06-15 MED ORDER — CEFAZOLIN SODIUM-DEXTROSE 2-4 GM/100ML-% IV SOLN
2.0000 g | INTRAVENOUS | Status: AC
  Administered 2019-06-15: 2 g via INTRAVENOUS
  Filled 2019-06-15: qty 100

## 2019-06-15 MED ORDER — HYDROXYZINE HCL 25 MG PO TABS: 50.0000 mg | ORAL_TABLET | ORAL | Status: DC | PRN

## 2019-06-15 MED ORDER — CYCLOBENZAPRINE HCL 10 MG PO TABS
ORAL_TABLET | ORAL | Status: AC
  Filled 2019-06-15: qty 1

## 2019-06-15 MED ORDER — CYCLOBENZAPRINE HCL 5 MG PO TABS
5.0000 mg | ORAL_TABLET | Freq: Three times a day (TID) | ORAL | Status: DC | PRN
  Administered 2019-06-15: 22:00:00 5 mg via ORAL
  Administered 2019-06-15: 12:00:00 10 mg via ORAL
  Filled 2019-06-15: qty 1

## 2019-06-15 MED ORDER — FLEET ENEMA 7-19 GM/118ML RE ENEM: 1.0000 | ENEMA | Freq: Once | RECTAL | Status: DC | PRN

## 2019-06-15 MED ORDER — ONDANSETRON HCL 4 MG/2ML IJ SOLN: 4.0000 mg | Freq: Four times a day (QID) | INTRAMUSCULAR | Status: DC | PRN

## 2019-06-15 MED ORDER — LACTATED RINGERS IV SOLN
INTRAVENOUS | Status: DC | PRN
  Administered 2019-06-15 (×2): via INTRAVENOUS

## 2019-06-15 MED ORDER — MAGNESIUM HYDROXIDE 400 MG/5ML PO SUSP: 30.0000 mL | Freq: Every day | ORAL | Status: DC | PRN

## 2019-06-15 MED ORDER — FENTANYL CITRATE (PF) 100 MCG/2ML IJ SOLN
INTRAMUSCULAR | Status: DC | PRN
  Administered 2019-06-15 (×7): 50 ug via INTRAVENOUS
  Administered 2019-06-15: 100 ug via INTRAVENOUS

## 2019-06-15 MED ORDER — KETOROLAC TROMETHAMINE 30 MG/ML IJ SOLN
30.0000 mg | Freq: Once | INTRAMUSCULAR | Status: AC
  Administered 2019-06-15: 12:00:00 30 mg via INTRAVENOUS

## 2019-06-15 MED ORDER — FENTANYL CITRATE (PF) 250 MCG/5ML IJ SOLN
INTRAMUSCULAR | Status: AC
  Filled 2019-06-15: qty 5

## 2019-06-15 MED ORDER — THROMBIN 5000 UNITS EX SOLR
OROMUCOSAL | Status: DC | PRN
  Administered 2019-06-15: 09:00:00 5 mL via TOPICAL

## 2019-06-15 MED ORDER — INSULIN ASPART 100 UNIT/ML ~~LOC~~ SOLN
0.0000 [IU] | Freq: Three times a day (TID) | SUBCUTANEOUS | Status: DC
  Administered 2019-06-15: 17:00:00 3 [IU] via SUBCUTANEOUS
  Administered 2019-06-15: 12:00:00 5 [IU] via SUBCUTANEOUS
  Administered 2019-06-16: 08:00:00 3 [IU] via SUBCUTANEOUS

## 2019-06-15 MED ORDER — SODIUM CHLORIDE 0.9 % IV SOLN: 250.0000 mL | INTRAVENOUS | Status: DC

## 2019-06-15 MED ORDER — ROCURONIUM BROMIDE 10 MG/ML (PF) SYRINGE
PREFILLED_SYRINGE | INTRAVENOUS | Status: DC | PRN
  Administered 2019-06-15: 70 mg via INTRAVENOUS
  Administered 2019-06-15: 30 mg via INTRAVENOUS

## 2019-06-15 MED ORDER — SODIUM CHLORIDE 0.9% FLUSH: 3.0000 mL | INTRAVENOUS | Status: DC | PRN

## 2019-06-15 MED ORDER — ONDANSETRON HCL 4 MG/2ML IJ SOLN
INTRAMUSCULAR | Status: DC | PRN
  Administered 2019-06-15: 4 mg via INTRAVENOUS

## 2019-06-15 MED ORDER — SUCCINYLCHOLINE CHLORIDE 20 MG/ML IJ SOLN
INTRAMUSCULAR | Status: DC | PRN
  Administered 2019-06-15: 140 mg via INTRAVENOUS

## 2019-06-15 MED ORDER — PROPOFOL 10 MG/ML IV BOLUS
INTRAVENOUS | Status: DC | PRN
  Administered 2019-06-15: 180 mg via INTRAVENOUS

## 2019-06-15 MED ORDER — ONDANSETRON HCL 4 MG PO TABS: 4.0000 mg | ORAL_TABLET | Freq: Four times a day (QID) | ORAL | Status: DC | PRN

## 2019-06-15 MED ORDER — LIDOCAINE 2% (20 MG/ML) 5 ML SYRINGE
INTRAMUSCULAR | Status: DC | PRN
  Administered 2019-06-15: 80 mg via INTRAVENOUS

## 2019-06-15 MED ORDER — OXYCODONE HCL 5 MG PO TABS: 5.0000 mg | ORAL_TABLET | Freq: Once | ORAL | Status: DC | PRN

## 2019-06-15 MED ORDER — REPAGLINIDE 1 MG PO TABS
0.5000 mg | ORAL_TABLET | Freq: Three times a day (TID) | ORAL | Status: DC
  Administered 2019-06-15 – 2019-06-16 (×2): 0.5 mg via ORAL
  Filled 2019-06-15 (×4): qty 0.5

## 2019-06-15 MED ORDER — PHENYLEPHRINE HCL-NACL 10-0.9 MG/250ML-% IV SOLN
INTRAVENOUS | Status: DC | PRN
  Administered 2019-06-15: 25 ug/min via INTRAVENOUS

## 2019-06-15 MED ORDER — DEXAMETHASONE SODIUM PHOSPHATE 10 MG/ML IJ SOLN
INTRAMUSCULAR | Status: AC
  Filled 2019-06-15: qty 1

## 2019-06-15 MED ORDER — LIDOCAINE 2% (20 MG/ML) 5 ML SYRINGE
INTRAMUSCULAR | Status: AC
  Filled 2019-06-15: qty 5

## 2019-06-15 MED ORDER — BUPIVACAINE HCL (PF) 0.5 % IJ SOLN
INTRAMUSCULAR | Status: DC | PRN
  Administered 2019-06-15: 10 mL

## 2019-06-15 MED ORDER — DEXAMETHASONE SODIUM PHOSPHATE 10 MG/ML IJ SOLN
INTRAMUSCULAR | Status: DC | PRN
  Administered 2019-06-15: 10 mg via INTRAVENOUS

## 2019-06-15 MED ORDER — THROMBIN 20000 UNITS EX SOLR
CUTANEOUS | Status: AC
  Filled 2019-06-15: qty 20000

## 2019-06-15 MED ORDER — FENTANYL CITRATE (PF) 100 MCG/2ML IJ SOLN
INTRAMUSCULAR | Status: AC
  Administered 2019-06-15: 12:00:00 50 ug via INTRAVENOUS
  Filled 2019-06-15: qty 2

## 2019-06-15 MED ORDER — HYDROXYZINE HCL 50 MG/ML IM SOLN: 50.0000 mg | INTRAMUSCULAR | Status: DC | PRN

## 2019-06-15 MED ORDER — HYDROCODONE-ACETAMINOPHEN 5-325 MG PO TABS
1.0000 | ORAL_TABLET | ORAL | Status: DC | PRN
  Administered 2019-06-15 – 2019-06-16 (×5): 2 via ORAL
  Filled 2019-06-15 (×5): qty 2

## 2019-06-15 MED ORDER — LORAZEPAM 0.5 MG PO TABS: 0.5000 mg | ORAL_TABLET | Freq: Two times a day (BID) | ORAL | Status: DC | PRN

## 2019-06-15 MED ORDER — SODIUM CHLORIDE 0.9 % IV SOLN
80.0000 mg | Freq: Once | INTRAVENOUS | Status: AC
  Administered 2019-06-15: 80 mg via INTRAVENOUS
  Filled 2019-06-15: qty 80

## 2019-06-15 MED ORDER — SODIUM CHLORIDE 0.9 % IV SOLN
INTRAVENOUS | Status: DC | PRN
  Administered 2019-06-15: 500 mL

## 2019-06-15 MED ORDER — ACETAMINOPHEN 325 MG PO TABS: 650.0000 mg | ORAL_TABLET | ORAL | Status: DC | PRN

## 2019-06-15 MED ORDER — PANTOPRAZOLE SODIUM 40 MG PO TBEC
80.0000 mg | DELAYED_RELEASE_TABLET | Freq: Every day | ORAL | Status: DC
  Administered 2019-06-15 – 2019-06-16 (×2): 80 mg via ORAL
  Filled 2019-06-15 (×2): qty 2

## 2019-06-15 MED ORDER — KETOROLAC TROMETHAMINE 30 MG/ML IJ SOLN
INTRAMUSCULAR | Status: AC
  Administered 2019-06-15: 12:00:00 30 mg via INTRAVENOUS
  Filled 2019-06-15: qty 1

## 2019-06-15 MED ORDER — ROCURONIUM BROMIDE 10 MG/ML (PF) SYRINGE
PREFILLED_SYRINGE | INTRAVENOUS | Status: AC
  Filled 2019-06-15: qty 10

## 2019-06-15 MED ORDER — BISACODYL 10 MG RE SUPP: 10.0000 mg | Freq: Every day | RECTAL | Status: DC | PRN

## 2019-06-15 MED ORDER — ACETAMINOPHEN 650 MG RE SUPP: 650.0000 mg | RECTAL | Status: DC | PRN

## 2019-06-15 MED ORDER — 0.9 % SODIUM CHLORIDE (POUR BTL) OPTIME
TOPICAL | Status: DC | PRN
  Administered 2019-06-15: 09:00:00 1000 mL

## 2019-06-15 MED ORDER — PHENOL 1.4 % MT LIQD: 1.0000 | OROMUCOSAL | Status: DC | PRN

## 2019-06-15 MED ORDER — LIDOCAINE-EPINEPHRINE 1 %-1:100000 IJ SOLN
INTRAMUSCULAR | Status: AC
  Filled 2019-06-15: qty 1

## 2019-06-15 MED ORDER — SUGAMMADEX SODIUM 200 MG/2ML IV SOLN
INTRAVENOUS | Status: DC | PRN
  Administered 2019-06-15: 200 mg via INTRAVENOUS

## 2019-06-15 MED ORDER — CANAGLIFLOZIN 100 MG PO TABS
100.0000 mg | ORAL_TABLET | Freq: Every day | ORAL | Status: DC
  Administered 2019-06-16: 08:00:00 100 mg via ORAL
  Filled 2019-06-15: qty 1

## 2019-06-15 MED ORDER — BUPIVACAINE HCL (PF) 0.5 % IJ SOLN
INTRAMUSCULAR | Status: AC
  Filled 2019-06-15: qty 30

## 2019-06-15 MED ORDER — PROPOFOL 10 MG/ML IV BOLUS
INTRAVENOUS | Status: AC
  Filled 2019-06-15: qty 20

## 2019-06-15 MED ORDER — ACETAMINOPHEN 10 MG/ML IV SOLN
INTRAVENOUS | Status: AC
  Filled 2019-06-15: qty 100

## 2019-06-15 MED ORDER — ONDANSETRON HCL 4 MG/2ML IJ SOLN
INTRAMUSCULAR | Status: AC
  Filled 2019-06-15: qty 2

## 2019-06-15 SURGICAL SUPPLY — 64 items
ADH SKN CLS APL DERMABOND .7 (GAUZE/BANDAGES/DRESSINGS) ×1
ALLOGRAFT 7X14X11 (Bone Implant) ×1 IMPLANT
BAG DECANTER FOR FLEXI CONT (MISCELLANEOUS) ×2 IMPLANT
BIT DRILL NEURO 2X3.1 SFT TUCH (MISCELLANEOUS) ×1 IMPLANT
BIT DRILL POWER (BIT) IMPLANT
BLADE ULTRA TIP 2M (BLADE) IMPLANT
CANISTER SUCT 3000ML PPV (MISCELLANEOUS) ×2 IMPLANT
CARTRIDGE OIL MAESTRO DRILL (MISCELLANEOUS) ×1 IMPLANT
COVER MAYO STAND STRL (DRAPES) ×2 IMPLANT
COVER WAND RF STERILE (DRAPES) ×2 IMPLANT
DECANTER SPIKE VIAL GLASS SM (MISCELLANEOUS) ×2 IMPLANT
DERMABOND ADVANCED (GAUZE/BANDAGES/DRESSINGS) ×1
DERMABOND ADVANCED .7 DNX12 (GAUZE/BANDAGES/DRESSINGS) ×1 IMPLANT
DIFFUSER DRILL AIR PNEUMATIC (MISCELLANEOUS) ×2 IMPLANT
DRAPE HALF SHEET 40X57 (DRAPES) ×1 IMPLANT
DRAPE LAPAROTOMY 100X72 PEDS (DRAPES) ×2 IMPLANT
DRAPE MICROSCOPE LEICA (MISCELLANEOUS) ×2 IMPLANT
DRAPE POUCH INSTRU U-SHP 10X18 (DRAPES) ×2 IMPLANT
DRILL BIT POWER (BIT) ×2
DRILL NEURO 2X3.1 SOFT TOUCH (MISCELLANEOUS) ×2
ELECT COATED BLADE 2.86 ST (ELECTRODE) ×2 IMPLANT
ELECT REM PT RETURN 9FT ADLT (ELECTROSURGICAL) ×2
ELECTRODE REM PT RTRN 9FT ADLT (ELECTROSURGICAL) ×1 IMPLANT
GAUZE SPONGE 4X4 16PLY XRAY LF (GAUZE/BANDAGES/DRESSINGS) ×1 IMPLANT
GLOVE BIOGEL PI IND STRL 7.0 (GLOVE) IMPLANT
GLOVE BIOGEL PI IND STRL 8 (GLOVE) ×1 IMPLANT
GLOVE BIOGEL PI INDICATOR 7.0 (GLOVE) ×1
GLOVE BIOGEL PI INDICATOR 8 (GLOVE) ×3
GLOVE ECLIPSE 7.5 STRL STRAW (GLOVE) ×5 IMPLANT
GLOVE ECLIPSE 9.0 STRL (GLOVE) ×1 IMPLANT
GLOVE EXAM NITRILE XL STR (GLOVE) IMPLANT
GOWN STRL REUS W/ TWL LRG LVL3 (GOWN DISPOSABLE) IMPLANT
GOWN STRL REUS W/ TWL XL LVL3 (GOWN DISPOSABLE) ×1 IMPLANT
GOWN STRL REUS W/TWL 2XL LVL3 (GOWN DISPOSABLE) ×2 IMPLANT
GOWN STRL REUS W/TWL LRG LVL3 (GOWN DISPOSABLE)
GOWN STRL REUS W/TWL XL LVL3 (GOWN DISPOSABLE) ×4
GRAFT CORT CANC 14X8.25X11 5D (Bone Implant) ×1 IMPLANT
HALTER HD/CHIN CERV TRACTION D (MISCELLANEOUS) ×2 IMPLANT
HEMOSTAT POWDER KIT SURGIFOAM (HEMOSTASIS) ×2 IMPLANT
KIT BASIN OR (CUSTOM PROCEDURE TRAY) ×2 IMPLANT
KIT TURNOVER KIT B (KITS) ×2 IMPLANT
NDL HYPO 25GX1X1/2 BEV (NEEDLE) ×1 IMPLANT
NDL SPNL 22GX3.5 QUINCKE BK (NEEDLE) ×1 IMPLANT
NEEDLE HYPO 25GX1X1/2 BEV (NEEDLE) ×2 IMPLANT
NEEDLE SPNL 22GX3.5 QUINCKE BK (NEEDLE) ×4 IMPLANT
NS IRRIG 1000ML POUR BTL (IV SOLUTION) ×2 IMPLANT
OIL CARTRIDGE MAESTRO DRILL (MISCELLANEOUS) ×2
PACK LAMINECTOMY NEURO (CUSTOM PROCEDURE TRAY) ×2 IMPLANT
PAD ARMBOARD 7.5X6 YLW CONV (MISCELLANEOUS) ×6 IMPLANT
PATTIES SURGICAL .5 X.5 (GAUZE/BANDAGES/DRESSINGS) ×1 IMPLANT
PLATE ARCHON-R 56MM 3 LEVEL (Plate) ×1 IMPLANT
RUBBERBAND STERILE (MISCELLANEOUS) ×4 IMPLANT
SCREW ARCHON SELFTAP 4.0X13MM (Screw) ×4 IMPLANT
SCREW ARCHON SELFTAP 4.0X15MM (Screw) ×4 IMPLANT
SCREW ARCHON SELFTAP 4.5X13 (Screw) ×1 IMPLANT
SCREW ARCHON ST VAR 4.5X15MM (Screw) ×1 IMPLANT
SPONGE INTESTINAL PEANUT (DISPOSABLE) ×3 IMPLANT
SPONGE SURGIFOAM ABS GEL 100 (HEMOSTASIS) ×2 IMPLANT
STAPLER SKIN PROX WIDE 3.9 (STAPLE) ×1 IMPLANT
SUT VIC AB 2-0 CP2 18 (SUTURE) ×2 IMPLANT
SUT VIC AB 3-0 SH 8-18 (SUTURE) ×3 IMPLANT
TOWEL GREEN STERILE (TOWEL DISPOSABLE) ×2 IMPLANT
TOWEL GREEN STERILE FF (TOWEL DISPOSABLE) ×2 IMPLANT
WATER STERILE IRR 1000ML POUR (IV SOLUTION) ×2 IMPLANT

## 2019-06-15 NOTE — Anesthesia Postprocedure Evaluation (Signed)
Anesthesia Post Note  Patient: Chase Sanchez  Procedure(s) Performed: ANTERIOR CERVICAL DECOMPRESSION/DISCECTOMY CERVICAL THREE- CERVICAL FOUR, CERVICAL FOUR- CERVICAL FIVE, REMOVAL OF AVIATOR PLATE (N/A Spine Cervical)     Patient location during evaluation: PACU Anesthesia Type: General Level of consciousness: awake and alert Pain management: pain level controlled Vital Signs Assessment: post-procedure vital signs reviewed and stable Respiratory status: spontaneous breathing, nonlabored ventilation and respiratory function stable Cardiovascular status: blood pressure returned to baseline and stable Postop Assessment: no apparent nausea or vomiting Anesthetic complications: no    Last Vitals:  Vitals:   06/15/19 1225 06/15/19 1259  BP: 132/88 (!) 142/82  Pulse: 96 93  Resp: 19 16  Temp: 36.7 C 36.7 C  SpO2: 94% 96%    Last Pain:  Vitals:   06/15/19 1259  TempSrc: Oral  PainSc:                  [name]

## 2019-06-15 NOTE — Op Note (Signed)
06/15/2019  11:05 AM  PATIENT:  Chase Sanchez  58 y.o. 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 Patient was placed in 10 pounds of halter traction. The neck was prepped with Betadine soap and solution and draped in a sterile fashion. A oblique incision was made on the left side of the neck, paralleling the anterior border the left sternocleidomastoid.. The line of the incision was infiltrated with local anesthetic with epinephrine. Dissection was carried down thru the subcutaneous tissue and platysma, bipolar cautery was used to maintain hemostasis. Dissection was then carried out thru an avascular plane leaving the sternocleidomastoid carotid artery and jugular vein laterally and the trachea and esophagus medially. The ventral aspect of the vertebral column was identified and we identified the existing C5-C7 aviator cervical plate.  Scar tissue overlying the plate was removed and we then unlocked the locking system, and removed each of the 6 screws and then remove the plate.  The screw holes were filled with Surgifoam and plugged with Gelfoam with thrombin.  We identified  the C3-4 and C4-5 levels. The annulus at each level was incised and the disc space entered. Discectomy was performed with micro-curettes and pituitary rongeurs. The operating microscope was draped and brought into the field provided additional magnification illumination and visualization. Discectomy was continued posteriorly thru the disc space and then the cartilaginous endplate was removed using micro-curettes along with the high-speed drill. Posterior osteophytic overgrowth was removed each level using the high-speed drill along with a 2 mm thin footplated Kerrison punch. Posterior longitudinal ligament along with disc herniation was carefully removed, decompressing the spinal canal and thecal sac. We then continued to remove osteophytic overgrowth and disc material decompressing the neural foramina and exiting nerve roots bilaterally. Once the decompression was completed hemostasis was established at each level with the use of Gelfoam with thrombin and bipolar cautery. The Gelfoam was removed, a thin layer Surgifoam applied, the wound irrigated and hemostasis confirmed. We then measured the height of the intravertebral disc space level and selected a 7 millimeter in height structural allograft for the C3-4 level and a 8 millimeter in height structural allograft for the C4-5 level . Each was hydrated in saline solution and then gently positioned in the intravertebral disc space and countersunk. We then selected a 56 millimeter in height Archon-R cervical plate. It was positioned over the fusion construct and secured to the vertebra with three 4 x 13 mm fixed self-tapping screws at the C3 level, at C4 a 4 x 15 mm fixed self-tapping screw was placed in the left and a 4.5 x 15 mm fixed self-tapping screw was placed on the right, the next pair of screw holes in the plate overlaid the interface between the inferior aspect of the C4-5 graft and the superior aspect of C5 and no screws were placed, and at the C6 level  three  4 x 15 mm fixed self-tapping screws were placed. Each screw hole was started with the high-speed drill and then the screws placed, once all the screws were placed, the locking system was secured at each level. The wound was irrigated with bacitracin solution checked for hemostasis which was established and confirmed. An x-ray was taken which showed the graft in good position, the plate and screws in good position, and the overall construct looked good both on x-ray and under direct visualization. We then proceeded with closure. The platysma was closed with interrupted inverted 2-0 undyed Vicryl suture, the subcutaneous and subcuticular closed with interrupted inverted 3-0 undyed Vicryl suture. The skin edges were approximated with Dermabond. Following surgery the patient was taken out of cervical traction and is to be reversed from the anesthetic, extubated, and taken to the recovery room for further care.  PLAN OF CARE: Admit for overnight observation  PATIENT DISPOSITION:  PACU - hemodynamically stable.   Delay start of Pharmacological VTE agent (>24hrs) due to surgical blood loss or risk of bleeding:  yes

## 2019-06-15 NOTE — Progress Notes (Signed)
Vitals:   06/15/19 1150 06/15/19 1225 06/15/19 1259 06/15/19 1650  BP: 118/85 132/88 (!) 142/82 (!) 144/87  Pulse: 92 96 93 76  Resp: 17 19 16 17   Temp:  98.1 F (36.7 C) 98.1 F (36.7 C) 97.6 F (36.4 C)  TempSrc:   Oral Oral  SpO2: 95% 94% 96% 97%  Weight:      Height:        Patient sitting up on the side of the bed, has already ambulated twice in the halls.  He has voided.  He notes that he has had excellent relief of the bilateral cervical radicular pain.  He does have moderate pain at the posterior base of the neck.  Incision is clean and dry; there is no erythema, ecchymosis, swelling, or drainage.  Plan: Patient is doing well following surgery.  Will plan on continuing to progress ambulation activity.  Hosie Spangle, MD 06/15/2019, 5:16 PM

## 2019-06-15 NOTE — Transfer of Care (Signed)
Immediate Anesthesia Transfer of Care Note  Patient: Chase Sanchez  Procedure(s) Performed: ANTERIOR CERVICAL DECOMPRESSION/DISCECTOMY CERVICAL THREE- CERVICAL FOUR, CERVICAL FOUR- CERVICAL FIVE, REMOVAL OF AVIATOR PLATE (N/A Spine Cervical)  Patient Location: PACU  Anesthesia Type:General  Level of Consciousness: awake, alert  and oriented  Airway & Oxygen Therapy: Patient Spontanous Breathing and Patient connected to nasal cannula oxygen  Post-op Assessment: Report given to RN, Post -op Vital signs reviewed and stable and Patient moving all extremities X 4  Post vital signs: Reviewed and stable  Last Vitals:  Vitals Value Taken Time  BP 116/76 06/15/19 1120  Temp    Pulse 104 06/15/19 1128  Resp 24 06/15/19 1128  SpO2 94 % 06/15/19 1128  Vitals shown include unvalidated device data.  Last Pain:  Vitals:   06/15/19 0630  TempSrc: Oral  PainSc: 0-No pain         Complications: No apparent anesthesia complications

## 2019-06-15 NOTE — Anesthesia Procedure Notes (Signed)
Procedure Name: Intubation Date/Time: 06/15/2019 7:32 AM Performed by: Kyung Rudd, CRNA Pre-anesthesia Checklist: Patient identified, Emergency Drugs available, Suction available and Patient being monitored Patient Re-evaluated:Patient Re-evaluated prior to induction Oxygen Delivery Method: Circle system utilized Preoxygenation: Pre-oxygenation with 100% oxygen Induction Type: IV induction and Rapid sequence Laryngoscope Size: Glidescope and 4 Tube type: Oral Tube size: 7.5 mm Number of attempts: 1 Airway Equipment and Method: Video-laryngoscopy Placement Confirmation: ETT inserted through vocal cords under direct vision,  positive ETCO2 and breath sounds checked- equal and bilateral Secured at: 21 cm Tube secured with: Tape Dental Injury: Teeth and Oropharynx as per pre-operative assessment

## 2019-06-15 NOTE — H&P (Signed)
Subjective: Patient is a 58 y.o. right-handed white male who is admitted for treatment of spondylitic disc herniation, spondylitic neuroforaminal stenosis, and resulting bilateral cervical radiculopathy left worse than right.  The patient is 27 months status post a C5-6 and C6-7 ACDF.  CT shows that those fusions are well-healed.  Patient been having posterior neck pain radiating over the shoulders and upper arms as well as into the suprascapular and pectoral regions.  Overall the symptoms are worse to the left side than to the right side.  CT and MRI show advanced spinal degeneration with spondylitic disc nation and spondylitic neuroforaminal stenosis, and patient is being admitted for C3-4 and C4-5 anterior cervical decompression arthrodesis with structural allograft and cervical plating.  We will plan on removing the existing C5-C7 anterior cervical plate.   Patient Active Problem List   Diagnosis Date Noted  . HNP (herniated nucleus pulposus), cervical 03/27/2017  . Chest pain 04/17/2011  . Dyspnea 04/17/2011  . Colon polyposis 12/25/2010  . OTITIS MEDIA 08/30/2010  . NEOPLASM OF UNCERTAIN BEHAVIOR OF SKIN 08/09/2010  . SINUSITIS, ACUTE 08/09/2010  . CAROTID BRUIT 08/09/2010  . OBESITY 11/30/2009  . ANXIETY 03/19/2009  . UNSPEC VENTRAL HERN W/O MENTION OBST/GANGREN 03/31/2008  . TOBACCO USE DISORDER/SMOKER-SMOKING CESSATION DISCUSSED 09/30/2007  . DIABETES MELLITUS, TYPE II 07/07/2007  . HYPOGONADISM 06/11/2007  . HYPERLIPIDEMIA 06/11/2007  . CORONARY ARTERY DISEASE 06/11/2007  . GERD 06/11/2007  . BARRETT'S ESOPHAGUS, HX OF 06/11/2007  . Other acquired absence of organ 06/11/2007  . Other postprocedural status(V45.89) 06/11/2007   Past Medical History:  Diagnosis Date  . Anxiety   . Arthritis    cervical  . Benign localized prostatic hyperplasia with lower urinary tract symptoms (LUTS)   . Bladder stone   . Carpal tunnel syndrome on both sides   . COPD (chronic obstructive  pulmonary disease) ([place])   . Coronary artery disease    per cardiac cath 12-14-1999  mild insignigicant cad  . Dupuytren's contracture of right hand   . Familial multiple polyposis syndrome    s/p  subtotal colectomy 07-04-2011  . Family history of diabetes mellitus   . GERD (gastroesophageal reflux disease)    watches diet  . History of cardiac murmur as a child   . History of nuclear stress test 04/19/2011   normal with no ischemia/  normal LV function and wall motion , ef 67%  . Hyperlipidemia   . Hypogonadism male   . Incomplete right bundle branch block   . Mild obstructive sleep apnea    per pt study 05-16-2004 in epic,  very mild osa ,  no cpap recommendation  . PONV (postoperative nausea and vomiting)   . Type 2 diabetes mellitus ([place])    followed by pcp  . Ventral hernia, unspecified, without mention of obstruction or gangrene   . Weak urinary stream     Past Surgical History:  Procedure Laterality Date  . ANTERIOR CERVICAL DECOMP/DISCECTOMY FUSION N/A 03/27/2017   Procedure: ANTERIOR CERVICAL DECOMPRESSION/DISCECTOMY FUSION CERVICAL FIVE- CERVICAL SIX, CERVICAL SIX- CERVICAL SEVEN;  Surgeon: Jovita Gamma, MD;  Location: [facility];  Service: Neurosurgery;  Laterality: N/A;  . APPENDECTOMY  age 50  . BACK SURGERY  2018   anterior cervical spine  . CARDIAC CATHETERIZATION  12/14/1999  @[facility]    mild insignificant CAD,  normal LVSF, ef 65% (mLAD 25%,  OM2 25%,  PDA 25%)  . COLON SURGERY    . LAPAROSCOPIC SUBTOTAL COLECTOMY  07-04-2011  @[facility]    w/  Ileorectal anatomsis (FAP)  . ROTATOR CUFF REPAIR Bilateral right 09-18-2000;  left 11-25-2002  dr Tonita Cong  @[facility]   . SEPTOPLASTY  2004 aaprox.  Marland Kitchen SHOULDER ARTHROSCOPY WITH SUBACROMIAL DECOMPRESSION AND OPEN ROTATOR C Right 07/05/2016   Procedure: RIGHT SHOULDER ARTHROSCOPY WITH SUBACROMIAL DECOMPRESSION;  Surgeon: Susa Day, MD;  Location: [facility];  Service: Orthopedics;  Laterality: Right;  . THULIUM LASER TURP (TRANSURETHRAL  RESECTION OF PROSTATE) N/A 10/07/2018   Procedure: Marcelino Duster LASER TURP (TRANSURETHRAL RESECTION OF PROSTATE), HOLMIUM LASER LITHOPAXY;  Surgeon: Festus Aloe, MD;  Location: [facility];  Service: Urology;  Laterality: N/A;  . TONSILLECTOMY  child    Medications Prior to Admission  Medication Sig Dispense Refill Last Dose  . aspirin EC 81 MG tablet Take 81 mg by mouth daily.   Past Month at Unknown time  . cyclobenzaprine (FLEXERIL) 10 MG tablet Take 5 mg by mouth 3 (three) times daily as needed for muscle spasms.    06/14/2019 at Unknown time  . dapagliflozin propanediol (FARXIGA) 10 MG TABS tablet Take 10 mg by mouth at bedtime.   06/14/2019 at Unknown time  . gabapentin (NEURONTIN) 300 MG capsule Take 300 mg by mouth daily as needed (nerve pain).    Past Month at Unknown time  . glimepiride (AMARYL) 4 MG tablet Take 4 mg by mouth daily with breakfast.    06/14/2019 at Unknown time  . LORazepam (ATIVAN) 1 MG tablet Take 0.5-1 mg by mouth 2 (two) times daily as needed for anxiety or sleep.    06/14/2019 at Unknown time  . metFORMIN (GLUCOPHAGE) 500 MG tablet Take 1,000 mg by mouth 2 (two) times daily with a meal.    06/14/2019 at Unknown time  . Omega-3 Fatty Acids (FISH OIL) 1000 MG CAPS Take 1 capsule by mouth daily.   Past Month at Unknown time  . omeprazole (PRILOSEC) 20 MG capsule Take 20 mg by mouth daily as needed (acid reflux).   06/14/2019 at Unknown time  . OZEMPIC, 1 MG/DOSE, 2 MG/1.5ML SOPN Inject 1 mg into the skin every Wednesday.   06/09/2019  . repaglinide (PRANDIN) 0.5 MG tablet Take 0.5 mg by mouth 3 (three) times daily before meals.    06/14/2019 at Unknown time  . tamsulosin (FLOMAX) 0.4 MG CAPS capsule Take 1 capsule (0.4 mg total) by mouth daily after supper. (Patient taking differently: Take 0.8 mg by mouth daily after supper. ) 45 capsule 0 06/14/2019 at Unknown time   Allergies  Allergen Reactions  . Iohexol Anaphylaxis and Shortness Of Breath  . Ivp Dye  [Iodinated Diagnostic Agents] Anaphylaxis, Shortness Of Breath and Nausea And Vomiting  . Codeine Hives, Itching and Nausea And Vomiting    Social History   Tobacco Use  . Smoking status: Current Every Day Smoker    Packs/day: 1.00    Years: 40.00    Pack years: 40.00    Types: Cigarettes  . Smokeless tobacco: Never Used  Substance Use Topics  . Alcohol use: Yes    Comment: occasional beer/wine    Family History  Problem Relation Age of Onset  . Cancer Mother        lung  . Cancer Father        prostate  . Diabetes Other      Review of Systems Pertinent items noted in HPI and remainder of comprehensive ROS otherwise negative.  Objective: Vital signs in last 24 hours: Temp:  [98.1 F (36.7 C)] 98.1 F (36.7 C) (11/09 0630)  Pulse Rate:  [70] 70 (11/09 0630) Resp:  [18] 18 (11/09 0630) BP: (110)/(67) 110/67 (11/09 0630) Weight:  [89.9 kg] 89.9 kg (11/09 0630)  EXAM: Patient well-developed well-nourished white male in no acute distress.   Lungs are clear to auscultation , the patient has symmetrical respiratory excursion. Heart has a regular rate and rhythm normal S1 and S2 no murmur.   Abdomen is soft nontender nondistended bowel sounds are present. Extremity examination shows no clubbing cyanosis or edema. Motor examination shows 5 over 5 strength in the upper extremities including the deltoid biceps triceps and intrinsics and grip. Sensation is intact to pinprick throughout the digits of the upper extremities. Reflexes are symmetrical and without evidence of pathologic reflexes. Patient has a normal gait and stance.   Data Review:CBC    Component Value Date/Time   WBC 12.4 (H) 06/10/2019 0944   RBC 5.83 (H) 06/10/2019 0944   HGB 16.9 06/10/2019 0944   HCT 50.8 06/10/2019 0944   PLT 232 06/10/2019 0944   MCV 87.1 06/10/2019 0944   MCH 29.0 06/10/2019 0944   MCHC 33.3 06/10/2019 0944   RDW 13.2 06/10/2019 0944   LYMPHSABS 0.9 10/09/2011 2034   MONOABS 0.6  10/09/2011 2034   EOSABS 0.0 10/09/2011 2034   BASOSABS 0.0 10/09/2011 2034                          BMET    Component Value Date/Time   NA 135 06/10/2019 0944   K 4.3 06/10/2019 0944   CL 104 06/10/2019 0944   CO2 21 (L) 06/10/2019 0944   GLUCOSE 147 (H) 06/10/2019 0944   BUN 12 06/10/2019 0944   CREATININE 0.76 06/10/2019 0944   CALCIUM 9.1 06/10/2019 0944   GFRNONAA >60 06/10/2019 0944   GFRAA >60 06/10/2019 0944     Assessment/Plan: Patient with bilateral cervical radiculopathy, left worse than right with advanced spondylitic degeneration at the C3-4 and C4-5 levels who is admitted now for a two-level ACDF.  I've discussed with the patient the nature of his condition, the nature the surgical procedure, the typical length of surgery, hospital stay, and overall recuperation. We discussed limitations postoperatively. I discussed risks of surgery including risks of infection, bleeding, possibly need for transfusion, the risk of nerve root dysfunction with pain, weakness, numbness, or paresthesias, the risk of spinal cord dysfunction with paralysis of all 4 limbs and quadriplegia, and the risk of dural tear and CSF leakage and possible need for further surgery, the risk of esophageal dysfunction causing dysphagia and the risk of laryngeal dysfunction causing hoarseness of the voice, the risk of failure of the arthrodesis and the possible need for further surgery, and the risk of anesthetic complications including myocardial infarction, stroke, pneumonia, and death. We also discussed the need for postoperative immobilization in a cervical collar. Understanding all this the patient does wish to proceed with surgery and is admitted for such.  Hosie Spangle, MD 06/15/2019 7:07 AM

## 2019-06-16 ENCOUNTER — Encounter (HOSPITAL_COMMUNITY): Payer: Self-pay | Admitting: Neurosurgery

## 2019-06-16 DIAGNOSIS — M5011 Cervical disc disorder with radiculopathy,  high cervical region: Secondary | ICD-10-CM | POA: Diagnosis not present

## 2019-06-16 LAB — GLUCOSE, CAPILLARY: Glucose-Capillary: 162 mg/dL — ABNORMAL HIGH (ref 70–99)

## 2019-06-16 MED ORDER — HYDROCODONE-ACETAMINOPHEN 5-325 MG PO TABS: 1.0000 | ORAL_TABLET | ORAL | 0 refills | Status: AC | PRN

## 2019-06-16 NOTE — Discharge Summary (Signed)
Physician Discharge Summary  Patient ID: Chase Sanchez MRN: [id number] DOB/AGE: 08-Jan-1961 58 y.o.  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  He has done well following surgery.  He is having moderate posterior neck pain but has had excellent relief of the bilateral cervical radicular pain that he was having over the shoulders into the proximal arms.  On the other hand he continues to have numbness and tingling in his hands and digits, and has previously had EMG and nerve conduction studies with Dr. Nelva Bush and bilateral carpal tunnel releases with Dr. Amedeo Plenty have been recommended.  I do think that he is going to need the bilateral carpal tunnel releases.  His cervical incision is healing well.  There is no erythema, ecchymosis, swelling, or drainage.  He is voiding well.  We are discharging him to home with instruction regarding wound care and activities.  He is to return for follow-up with me in the office with x-ray in 3 weeks.  He is immobilized in a Aspen cervical collar.  Discharge Exam: Blood pressure 116/81, pulse 75, temperature 97.6 F (36.4 C), temperature source Oral, resp. rate 16, height 5\' 11"  (1.803 m), weight 89.9 kg, SpO2 98 %.  Disposition: Discharge disposition:  01-Home or Self Care       Discharge Instructions    Discharge wound care:   Complete by: As directed    Leave the wound open to air. Shower daily with the wound uncovered. Water and soapy water should run over the incision area. Do not wash directly on the incision for 2 weeks. Remove the glue after 2 weeks.   Driving Restrictions   Complete by: As directed    No driving for 2 weeks. May ride in the car locally now. May begin to drive locally in 2 weeks.   Other Restrictions   Complete by: As directed    Walk gradually increasing distances out in the fresh air at least twice a day. Walking additional 6 times inside the house, gradually increasing distances, daily. No bending, lifting, or twisting. Perform activities between shoulder and waist height (that is at counter height when standing or table height when sitting).     Allergies as of 06/16/2019      Reactions   Iohexol Anaphylaxis, Shortness Of Breath   Ivp Dye [iodinated Diagnostic Agents] Anaphylaxis, Shortness Of Breath, Nausea And Vomiting   Codeine Hives, Itching, Nausea And Vomiting      Medication List    TAKE these medications   aspirin EC 81 MG tablet Take 81 mg by mouth daily.   cyclobenzaprine 10 MG tablet Commonly known as: FLEXERIL Take 5 mg by mouth 3 (three) times daily as needed for muscle spasms.   Farxiga 10 MG Tabs tablet Generic drug: dapagliflozin propanediol Take 10 mg by mouth at bedtime.   Fish Oil 1000 MG Caps Take 1 capsule by mouth daily.   gabapentin 300 MG capsule Commonly known as: NEURONTIN Take 300 mg by mouth  daily as needed (nerve pain).   glimepiride 4 MG tablet Commonly known as: AMARYL Take 4 mg by mouth daily with breakfast.   HYDROcodone-acetaminophen 5-325 MG tablet Commonly known as: NORCO/VICODIN Take 1-2 tablets by mouth every 4 (four) hours as needed (pain).   LORazepam 1 MG tablet Commonly known as: ATIVAN Take 0.5-1 mg by mouth 2 (two) times daily as needed  for anxiety or sleep.   metFORMIN 500 MG tablet Commonly known as: GLUCOPHAGE Take 1,000 mg by mouth 2 (two) times daily with a meal.   omeprazole 20 MG capsule Commonly known as: PRILOSEC Take 20 mg by mouth daily as needed (acid reflux).   Ozempic (1 MG/DOSE) 2 MG/1.5ML Sopn Generic drug: Semaglutide (1 MG/DOSE) Inject 1 mg into the skin every Wednesday.   repaglinide 0.5 MG tablet Commonly known as: PRANDIN Take 0.5 mg by mouth 3 (three) times daily before meals.   tamsulosin 0.4 MG Caps capsule Commonly known as: FLOMAX Take 1 capsule (0.4 mg total) by mouth daily after supper. What changed: how much to take            Discharge Care Instructions  (From admission, onward)         Start     Ordered   06/16/19 0000  Discharge wound care:    Comments: Leave the wound open to air. Shower daily with the wound uncovered. Water and soapy water should run over the incision area. Do not wash directly on the incision for 2 weeks. Remove the glue after 2 weeks.   06/16/19 0719           Signed: Hosie Spangle 06/16/2019, 7:19 AM

## 2019-06-16 NOTE — Discharge Instructions (Signed)
  Call Your Doctor If Any of These Occur Redness, drainage, or swelling at the wound.  Temperature greater than 101 degrees. Severe pain not relieved by pain medication. Increased difficulty swallowing. Incision starts to come apart. Follow Up Appt Call today for appointment in 3 weeks ([phone]) or for problems.  If you have any hardware placed in your spine, you will need an x-ray before your appointment.   

## 2019-06-16 NOTE — Plan of Care (Signed)
Patient alert and oriented, mae's well, voiding adequate amount of urine, swallowing without difficulty, no c/o pain at time of discharge. Patient discharged home with family. Script and discharged instructions given to patient. Patient and family stated understanding of instructions given. Patient has an appointment with Dr. Sherwood Gambler in three weeks.

## 2019-06-23 LAB — GLUCOSE, CAPILLARY: Glucose-Capillary: 195 mg/dL — ABNORMAL HIGH (ref 70–99)

## 2019-07-13 ENCOUNTER — Other Ambulatory Visit: Payer: Self-pay

## 2019-07-13 ENCOUNTER — Other Ambulatory Visit: Payer: Self-pay | Admitting: Neurosurgery

## 2019-07-13 ENCOUNTER — Ambulatory Visit
Admission: RE | Admit: 2019-07-13 | Discharge: 2019-07-13 | Disposition: A | Discharge status: Home / Self Care | Payer: BC Managed Care – PPO | Source: Ambulatory Visit | Attending: Neurosurgery | Admitting: Neurosurgery

## 2019-07-13 DIAGNOSIS — M4722 Other spondylosis with radiculopathy, cervical region: Secondary | ICD-10-CM

## 2020-08-01 IMAGING — CT CT CERVICAL SPINE W/O CM
2 series · 10 of 14 positions shown, 12 images · non-contrast
Comparison: Cervical spine radiographs 07/10/2019 and 06/26/2019

CLINICAL DATA: Severe upper extremity weakness. Cervical fusion 4
weeks ago. Severe posterior neck pain extending into the right
shoulder. Pain was previously on the left.

EXAM:
CT CERVICAL SPINE WITHOUT CONTRAST
TECHNIQUE: Multidetector CT imaging of the cervical spine was performed without
intravenous contrast. Multiplanar CT image reconstructions were also
generated.

[Series 3: cspine soft · axial · 0.37mm/px · z∈[+872,+1008]mm · 5 of 102 slices shown]
[im 17/102  soft-tissue]
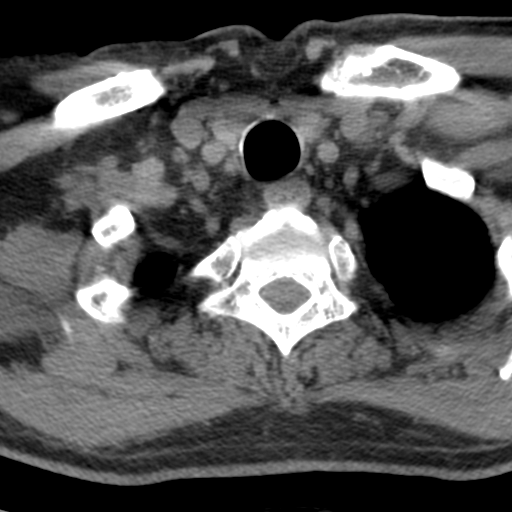
[im 34/102  soft-tissue]
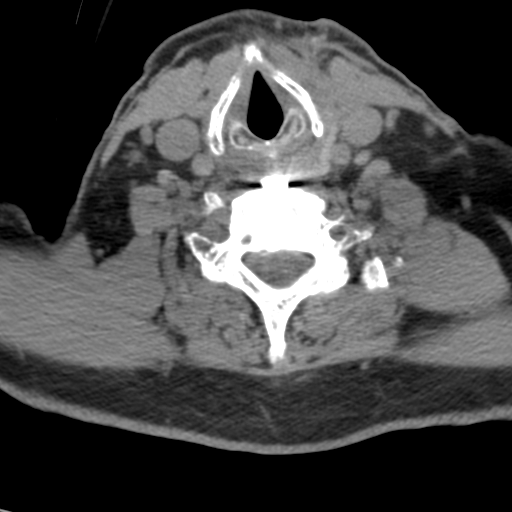
[im 51/102  soft-tissue]
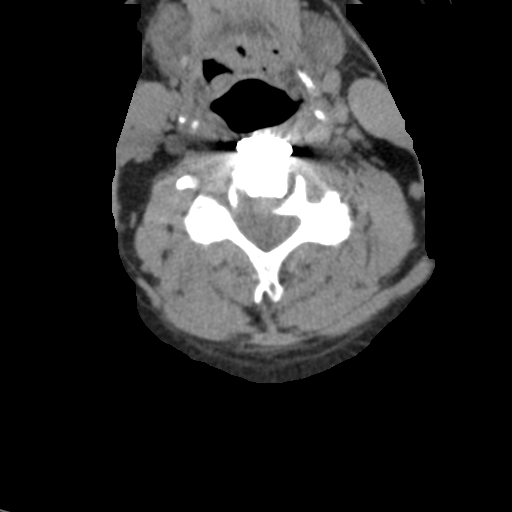
[im 68/102  soft-tissue]
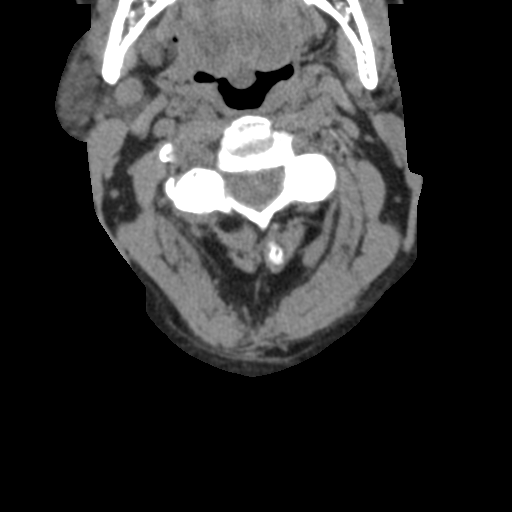
[im 85/102  soft-tissue]
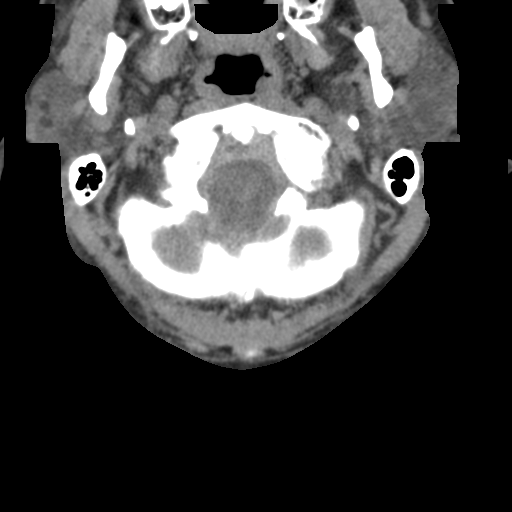

[Series 9: angled axial (person_name) · axial · 0.29mm/px · z∈[+859,+994]mm · 5 of 102 slices shown, 7 images]
[im 17/102  soft-tissue]
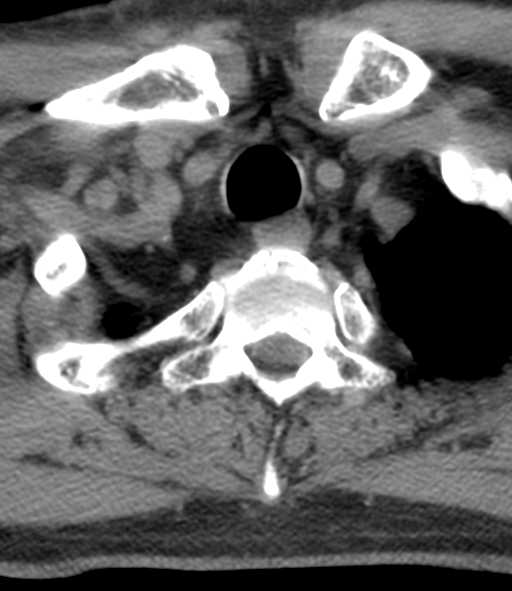
[im 17/102  bone]
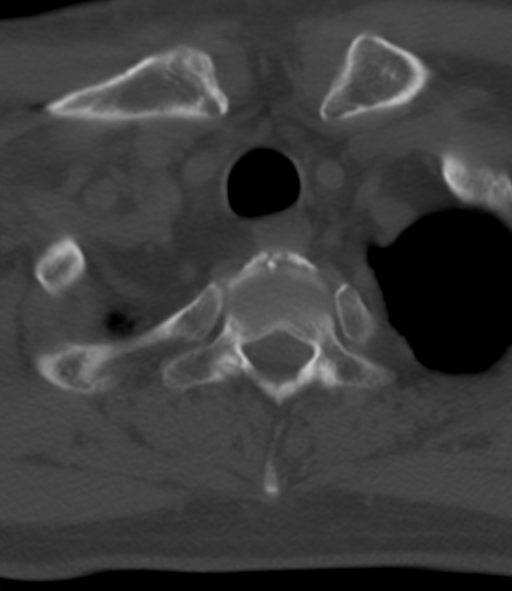
[im 34/102  bone]
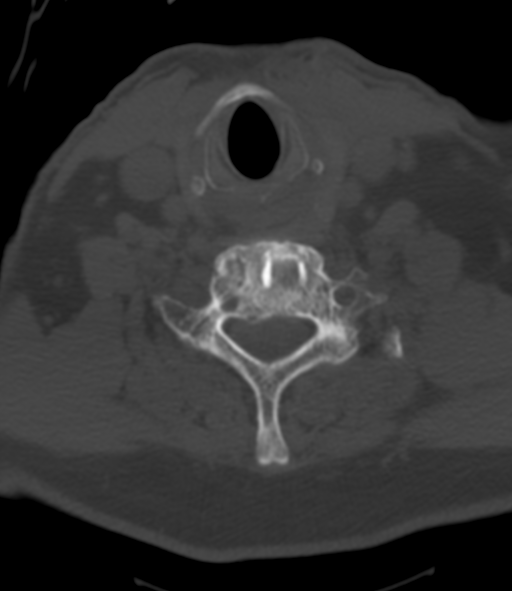
[im 51/102  bone]
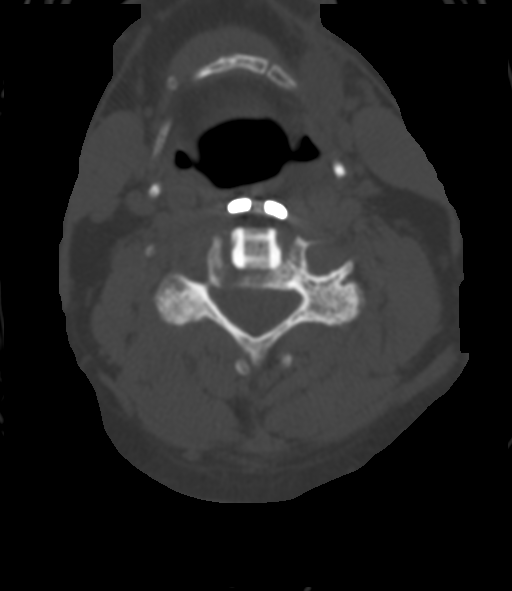
[im 68/102  bone]
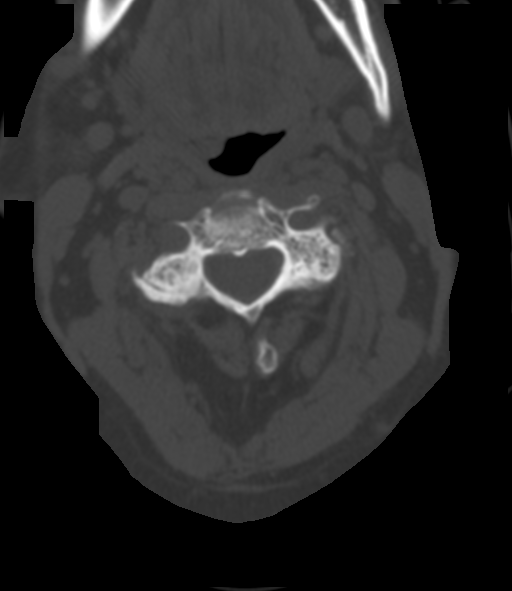
[im 85/102  soft-tissue]
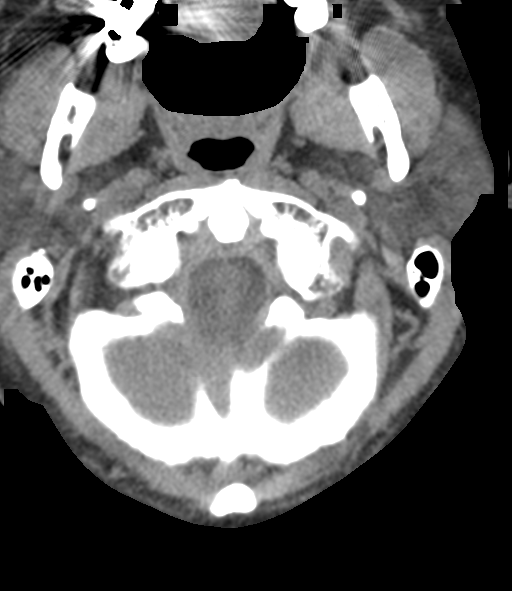
[im 85/102  bone]
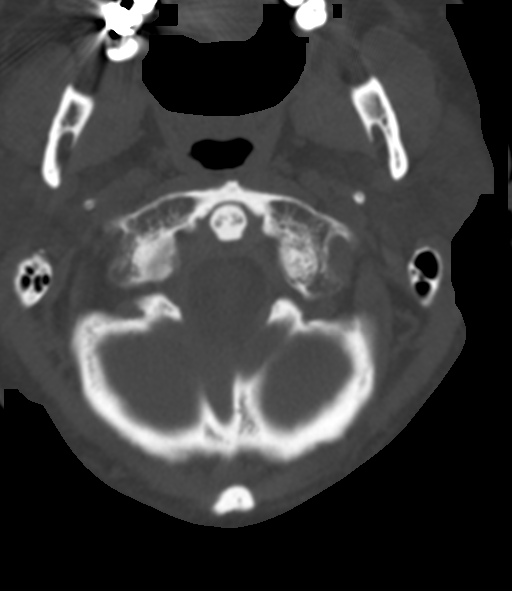

[10 of 14 positions shown; findings below may reference images not displayed]

FINDINGS: Alignment: AP alignment is anatomic. There straightening of the
normal cervical lordosis.

Skull base and vertebrae: The craniocervical junction is normal.
Vertebral body heights are maintained. No acute or healing fractures
are present.

Soft tissues and spinal canal: Soft tissues the neck are
unremarkable. No significant prevertebral soft tissue fluid or edema
is present.

Disc levels: There is stable chronic fusion at C5-6 and C6-7.
Chronic moderate osseous foraminal narrowing is present at both
levels.

Fusion was extended to include C3-4 and C4-5. Disc spacers the are
in stable position of both of these levels. Hardware is intact.

C4-5: Moderate foraminal narrowing is worse right than left due to
uncovertebral disease on the right.

C3-4: Severe left foraminal stenosis is due to uncovertebral and
facet disease.

The central canal is decompressed at both levels.

C2-3: Uncovertebral spurring is present without significant
stenosis.

Upper chest: Paraseptal emphysematous changes are present.
IMPRESSION: 1. Stable appearance of chronic fusion at C5-6 and C6-7.
2. Moderate osseous foraminal narrowing bilaterally at C4-5 is worse
on the right.
3. Anterior fusion extending superiorly to involve C3-4 and C4-5.
Hardware is intact. Spacers are stable
4. Severe left foraminal stenosis at C3-4.
5. Moderate foraminal narrowing bilaterally at C4-5 is worse on the
right.
6. No significant central canal stenosis.

## 2021-03-03 DIAGNOSIS — Z0001 Encounter for general adult medical examination with abnormal findings: Secondary | ICD-10-CM | POA: Diagnosis not present

## 2021-03-03 DIAGNOSIS — G479 Sleep disorder, unspecified: Secondary | ICD-10-CM | POA: Diagnosis not present

## 2021-03-03 DIAGNOSIS — E7800 Pure hypercholesterolemia, unspecified: Secondary | ICD-10-CM | POA: Diagnosis not present

## 2021-03-03 DIAGNOSIS — K219 Gastro-esophageal reflux disease without esophagitis: Secondary | ICD-10-CM | POA: Diagnosis not present

## 2021-03-03 DIAGNOSIS — Z23 Encounter for immunization: Secondary | ICD-10-CM | POA: Diagnosis not present

## 2021-03-03 DIAGNOSIS — E1169 Type 2 diabetes mellitus with other specified complication: Secondary | ICD-10-CM | POA: Diagnosis not present

## 2021-03-03 DIAGNOSIS — R0781 Pleurodynia: Secondary | ICD-10-CM | POA: Diagnosis not present

## 2021-03-08 DIAGNOSIS — M5030 Other cervical disc degeneration, unspecified cervical region: Secondary | ICD-10-CM | POA: Diagnosis not present

## 2021-03-20 DIAGNOSIS — E1165 Type 2 diabetes mellitus with hyperglycemia: Secondary | ICD-10-CM | POA: Diagnosis not present

## 2021-03-31 DIAGNOSIS — K635 Polyp of colon: Secondary | ICD-10-CM | POA: Diagnosis not present

## 2021-03-31 DIAGNOSIS — Z86010 Personal history of colonic polyps: Secondary | ICD-10-CM | POA: Diagnosis not present

## 2021-03-31 DIAGNOSIS — Z1211 Encounter for screening for malignant neoplasm of colon: Secondary | ICD-10-CM | POA: Diagnosis not present

## 2021-03-31 DIAGNOSIS — D127 Benign neoplasm of rectosigmoid junction: Secondary | ICD-10-CM | POA: Diagnosis not present

## 2021-04-06 DIAGNOSIS — M25511 Pain in right shoulder: Secondary | ICD-10-CM | POA: Diagnosis not present

## 2021-04-06 DIAGNOSIS — M25512 Pain in left shoulder: Secondary | ICD-10-CM | POA: Diagnosis not present

## 2021-06-01 DIAGNOSIS — G4733 Obstructive sleep apnea (adult) (pediatric): Secondary | ICD-10-CM | POA: Diagnosis not present

## 2021-06-20 DIAGNOSIS — M25511 Pain in right shoulder: Secondary | ICD-10-CM | POA: Diagnosis not present

## 2021-06-20 DIAGNOSIS — M25512 Pain in left shoulder: Secondary | ICD-10-CM | POA: Diagnosis not present

## 2021-06-20 DIAGNOSIS — M542 Cervicalgia: Secondary | ICD-10-CM | POA: Diagnosis not present

## 2021-06-23 DIAGNOSIS — G4733 Obstructive sleep apnea (adult) (pediatric): Secondary | ICD-10-CM | POA: Diagnosis not present

## 2021-07-04 DIAGNOSIS — R030 Elevated blood-pressure reading, without diagnosis of hypertension: Secondary | ICD-10-CM | POA: Diagnosis not present

## 2021-07-04 DIAGNOSIS — M542 Cervicalgia: Secondary | ICD-10-CM | POA: Diagnosis not present

## 2021-07-12 DIAGNOSIS — M4322 Fusion of spine, cervical region: Secondary | ICD-10-CM | POA: Diagnosis not present

## 2021-07-12 DIAGNOSIS — M542 Cervicalgia: Secondary | ICD-10-CM | POA: Diagnosis not present

## 2021-07-25 DIAGNOSIS — M542 Cervicalgia: Secondary | ICD-10-CM | POA: Diagnosis not present

## 2021-07-25 DIAGNOSIS — R030 Elevated blood-pressure reading, without diagnosis of hypertension: Secondary | ICD-10-CM | POA: Diagnosis not present

## 2021-07-25 DIAGNOSIS — M47812 Spondylosis without myelopathy or radiculopathy, cervical region: Secondary | ICD-10-CM | POA: Diagnosis not present

## 2021-09-21 DIAGNOSIS — M25512 Pain in left shoulder: Secondary | ICD-10-CM | POA: Diagnosis not present

## 2021-09-21 DIAGNOSIS — M25511 Pain in right shoulder: Secondary | ICD-10-CM | POA: Diagnosis not present

## 2021-09-30 DIAGNOSIS — M25511 Pain in right shoulder: Secondary | ICD-10-CM | POA: Diagnosis not present

## 2021-10-05 DIAGNOSIS — M25511 Pain in right shoulder: Secondary | ICD-10-CM | POA: Diagnosis not present

## 2022-01-12 DIAGNOSIS — E119 Type 2 diabetes mellitus without complications: Secondary | ICD-10-CM | POA: Diagnosis not present

## 2022-01-12 DIAGNOSIS — H524 Presbyopia: Secondary | ICD-10-CM | POA: Diagnosis not present

## 2022-03-20 DIAGNOSIS — G4733 Obstructive sleep apnea (adult) (pediatric): Secondary | ICD-10-CM | POA: Diagnosis not present

## 2022-03-23 DIAGNOSIS — E1169 Type 2 diabetes mellitus with other specified complication: Secondary | ICD-10-CM | POA: Diagnosis not present

## 2022-03-23 DIAGNOSIS — Z0001 Encounter for general adult medical examination with abnormal findings: Secondary | ICD-10-CM | POA: Diagnosis not present

## 2022-03-23 DIAGNOSIS — E7800 Pure hypercholesterolemia, unspecified: Secondary | ICD-10-CM | POA: Diagnosis not present

## 2022-03-23 DIAGNOSIS — G479 Sleep disorder, unspecified: Secondary | ICD-10-CM | POA: Diagnosis not present

## 2022-05-16 DIAGNOSIS — E785 Hyperlipidemia, unspecified: Secondary | ICD-10-CM | POA: Diagnosis not present

## 2022-05-16 DIAGNOSIS — E1165 Type 2 diabetes mellitus with hyperglycemia: Secondary | ICD-10-CM | POA: Diagnosis not present

## 2022-05-31 ENCOUNTER — Ambulatory Visit (INDEPENDENT_AMBULATORY_CARE_PROVIDER_SITE_OTHER): Payer: Medicare Other | Admitting: Orthopedic Surgery

## 2022-05-31 DIAGNOSIS — M79601 Pain in right arm: Secondary | ICD-10-CM | POA: Diagnosis not present

## 2022-06-02 ENCOUNTER — Encounter: Payer: Self-pay | Admitting: Orthopedic Surgery

## 2022-06-02 NOTE — Progress Notes (Signed)
Office Visit Note   Patient: Chase Sanchez           Date of Birth: 09/10/1960           MRN: [id number] Visit Date: 05/31/2022 Requested by: Alroy Dust, L.Marlou Sa, [place] [place] [address] [employer] [address] [place],  [place] [address] PCP: Alroy Dust, L.Marlou Sa, MD  Subjective: Chief Complaint  Patient presents with   Right Shoulder - Pain    HPI: Chase Sanchez is a 61 y.o. male who presents to the office reporting right shoulder pain.  He is here for second opinion.  Describes chronic pain in the right shoulder.  He has undergone right shoulder rotator cuff tear repair in the past.  He has had a recent MRI scan.  That report is available by narration but the actual images are not available.  He has been told that he cannot have any further rotator cuff tear repair and requires arthroplasty.  He describes decreased strength and decreased range of motion in the shoulder which wakes him from pain.  He is right-hand dominant.  Describes constant aching pain but it is worse at night.  Also reports some neck pain and catching.  Has some radicular pain involving the arm as well including numbness and tingling in the fingers.  Has good and bad days.  MRI scan does show retracted rotator cuff tear with atrophy.  Notably he has a history of cervical spine fusion x2 done by Dr. Sherwood Gambler.  He is retired from [employer] but still does work on the side.  He did have a very difficult experience with his last right shoulder rotator cuff surgery in terms of pain.  He has had MRI and radiographs within the past 3 to 4 months.  Tylenol has not been helpful.  He has not had an MRI scan of the cervical spine in over a year.  .                ROS: All systems reviewed are negative as they relate to the chief complaint within the history of present illness.  Patient denies fevers or chills.  Assessment & Plan: Visit Diagnoses:  1. Right arm pain     Plan: Impression is right shoulder rotator cuff arthropathy with  significant pain in the shoulder.  Does not look like there is any occult infection in the shoulder.  This could be radicular in nature as well in terms of adjacent segment disease from prior fusion in the cervical spine.  The risk and benefits of reverse shoulder replacement I discussed with Nehan i including not limited to infection or vessel damage incomplete pain relief instability as well as incomplete functional restoration of the strength in his shoulder particularly with overhead motion.  Patient understands risk and benefits and would like to proceed with the work-up which would include thin cut CT scan for preop patient specific instrumentation and planning along with MRI cervical spine to evaluate possible residual right-sided radiculopathy versus shoulder arthritis is the primary pain generator in his arm.  Anticipate 1-2 night hospital stay due to his significantly increased pain after his last right shoulder surgery.  He does have a brother who underwent reverse shoulder replacement and has done well with that. Follow-Up Instructions: No follow-ups on file.   Orders:  Orders Placed This Encounter  Procedures   MR Cervical Spine w/o contrast   CT SHOULDER RIGHT WO CONTRAST   No orders of the defined types were placed in this encounter.  Procedures: No procedures performed   Clinical Data: No additional findings.  Objective: Vital Signs: There were no vitals taken for this visit.  Physical Exam:  Constitutional: Patient appears well-developed HEENT:  Head: Normocephalic Eyes:EOM are normal Neck: Normal range of motion Cardiovascular: Normal rate Pulmonary/chest: Effort normal Neurologic: Patient is alert Skin: Skin is warm Psychiatric: Patient has normal mood and affect  Ortho Exam: Ortho exam demonstrates cervical spine range of motion flexion chin almost to chest extension 30 degrees rotation is about 50 degrees bilaterally.  5 out of 5 grip EPL FPL interosseous  wrist flexion extension bicep triceps and deltoid strength.  He does have some diminished external rotation strength on the right compared to the left.  Significant crepitus with internal/external rotation of the right arm at 90 degrees of abduction.  Passive range of motion is 50/90/155.  Deltoid is functional.  Subscap strength feels like it is intact.  No warmth or lymphadenopathy around the shoulder itself.  Reflexes symmetric bilateral biceps triceps with no definite paresthesias C5-T1.  Specialty Comments:  No specialty comments available.  Imaging: No results found.   PMFS History: Patient Active Problem List   Diagnosis Date Noted   HNP (herniated nucleus pulposus), cervical 03/27/2017   Chest pain 04/17/2011   Dyspnea 04/17/2011   Colon polyposis 12/25/2010   OTITIS MEDIA 08/30/2010   NEOPLASM OF UNCERTAIN BEHAVIOR OF SKIN 08/09/2010   SINUSITIS, ACUTE 08/09/2010   CAROTID BRUIT 08/09/2010   OBESITY 11/30/2009   ANXIETY 03/19/2009   UNSPEC VENTRAL HERN W/O MENTION OBST/GANGREN 03/31/2008   TOBACCO USE DISORDER/SMOKER-SMOKING CESSATION DISCUSSED 09/30/2007   DIABETES MELLITUS, TYPE II 07/07/2007   HYPOGONADISM 06/11/2007   HYPERLIPIDEMIA 06/11/2007   CORONARY ARTERY DISEASE 06/11/2007   GERD 06/11/2007   BARRETT'S ESOPHAGUS, HX OF 06/11/2007   Other acquired absence of organ 06/11/2007   Other postprocedural status(V45.89) 06/11/2007   Past Medical History:  Diagnosis Date   Anxiety    Arthritis    cervical   Benign localized prostatic hyperplasia with lower urinary tract symptoms (LUTS)    Bladder stone    Carpal tunnel syndrome on both sides    COPD (chronic obstructive pulmonary disease) (HCC)    Coronary artery disease    per cardiac cath 12-14-1999  mild insignigicant cad   Dupuytren's contracture of right hand    Familial multiple polyposis syndrome    s/p  subtotal colectomy 07-04-2011   Family history of diabetes mellitus    GERD (gastroesophageal  reflux disease)    watches diet   History of cardiac murmur as a child    History of nuclear stress test 04/19/2011   normal with no ischemia/  normal LV function and wall motion , ef 67%   Hyperlipidemia    Hypogonadism male    Incomplete right bundle branch block    Mild obstructive sleep apnea    per pt study 05-16-2004 in epic,  very mild osa ,  no cpap recommendation   PONV (postoperative nausea and vomiting)    Type 2 diabetes mellitus ([place])    followed by pcp   Ventral hernia, unspecified, without mention of obstruction or gangrene    Weak urinary stream     Family History  Problem Relation Age of Onset   Cancer Mother        lung   Cancer Father        prostate   Diabetes Other     Past Surgical History:  Procedure Laterality Date   ANTERIOR  CERVICAL DECOMP/DISCECTOMY FUSION N/A 03/27/2017   Procedure: ANTERIOR CERVICAL DECOMPRESSION/DISCECTOMY FUSION CERVICAL FIVE- CERVICAL SIX, CERVICAL SIX- CERVICAL SEVEN;  Surgeon: Jovita Gamma, MD;  Location: [facility];  Service: Neurosurgery;  Laterality: N/A;   ANTERIOR CERVICAL DECOMP/DISCECTOMY FUSION N/A 06/15/2019   Procedure: ANTERIOR CERVICAL DECOMPRESSION/DISCECTOMY CERVICAL THREE- CERVICAL FOUR, CERVICAL FOUR- CERVICAL FIVE, REMOVAL OF AVIATOR PLATE;  Surgeon: Jovita Gamma, MD;  Location: [facility];  Service: Neurosurgery;  Laterality: N/A;   APPENDECTOMY  age 72   BACK SURGERY  2018   anterior cervical spine   CARDIAC CATHETERIZATION  12/14/1999  '@[facility]'$    mild insignificant CAD,  normal LVSF, ef 65% (mLAD 25%,  OM2 25%,  PDA 25%)   COLON SURGERY     LAPAROSCOPIC SUBTOTAL COLECTOMY  07-04-2011  '@[facility]'$    w/  Ileorectal anatomsis (FAP)   ROTATOR CUFF REPAIR Bilateral right 09-18-2000;  left 11-25-2002  dr Tonita Cong  '@[facility]'$    SEPTOPLASTY  2004 aaprox.   SHOULDER ARTHROSCOPY WITH SUBACROMIAL DECOMPRESSION AND OPEN ROTATOR C Right 07/05/2016   Procedure: RIGHT SHOULDER ARTHROSCOPY WITH SUBACROMIAL DECOMPRESSION;  Surgeon: Susa Day, MD;  Location: [facility];  Service: Orthopedics;  Laterality: Right;   THULIUM LASER TURP (TRANSURETHRAL RESECTION OF PROSTATE) N/A 10/07/2018   Procedure: Marcelino Duster LASER TURP (TRANSURETHRAL RESECTION OF PROSTATE), HOLMIUM LASER LITHOPAXY;  Surgeon: Festus Aloe, MD;  Location: [facility];  Service: Urology;  Laterality: N/A;   TONSILLECTOMY  child   Social History   Occupational History   Not on file  Tobacco Use   Smoking status: Every Day    Packs/day: 1.00    Years: 40.00    Total pack years: 40.00    Types: Cigarettes   Smokeless tobacco: Never  Vaping Use   Vaping Use: Never used  Substance and Sexual Activity   Alcohol use: Yes    Comment: occasional beer/wine   Drug use: Never   Sexual activity: Yes

## 2022-06-04 ENCOUNTER — Ambulatory Visit
Admission: RE | Admit: 2022-06-04 | Discharge: 2022-06-04 | Disposition: A | Discharge status: Home / Self Care | Payer: Medicare Other | Source: Ambulatory Visit | Attending: Orthopedic Surgery | Admitting: Orthopedic Surgery

## 2022-06-04 DIAGNOSIS — M4802 Spinal stenosis, cervical region: Secondary | ICD-10-CM | POA: Diagnosis not present

## 2022-06-04 DIAGNOSIS — R200 Anesthesia of skin: Secondary | ICD-10-CM | POA: Diagnosis not present

## 2022-06-04 DIAGNOSIS — M79601 Pain in right arm: Secondary | ICD-10-CM

## 2022-06-11 ENCOUNTER — Ambulatory Visit
Admission: RE | Admit: 2022-06-11 | Discharge: 2022-06-11 | Disposition: A | Discharge status: Home / Self Care | Payer: Medicare Other | Source: Ambulatory Visit | Attending: Orthopedic Surgery | Admitting: Orthopedic Surgery

## 2022-06-11 DIAGNOSIS — M19011 Primary osteoarthritis, right shoulder: Secondary | ICD-10-CM | POA: Diagnosis not present

## 2022-06-11 DIAGNOSIS — M25411 Effusion, right shoulder: Secondary | ICD-10-CM | POA: Diagnosis not present

## 2022-06-11 DIAGNOSIS — M79601 Pain in right arm: Secondary | ICD-10-CM

## 2022-06-11 NOTE — Progress Notes (Signed)
I called and left message that I do not really think anything is pinching the nerve at the shoulder level.  Down around C7 and T1 may be having a little bit of hand numbness from that but even that does not look worse compared to a year ago so my plan would be to press on with shoulder replacement I left him a message to that effect and he can call me earlier and we can get him set up for when he is waiting till he comes in at the end of the week.

## 2022-06-12 ENCOUNTER — Telehealth: Payer: Self-pay

## 2022-06-12 NOTE — Telephone Encounter (Signed)
Tried calling as well to make sure message received from Dr Marlou Sa, no answer.

## 2022-06-12 NOTE — Telephone Encounter (Signed)
-----   Message from Meredith Pel, MD sent at 06/11/2022  5:32 PM EST ----- I called and left message that I do not really think anything is pinching the nerve at the shoulder level.  Down around C7 and T1 may be having a little bit of hand numbness from that but even that does not look worse compared to a year ago so my pla n would be to press on with shoulder replacement I left him a message to that effect and he can call me earlier and we can get him set up for when he is waiting till he comes in at the end of the week.

## 2022-06-15 ENCOUNTER — Ambulatory Visit (INDEPENDENT_AMBULATORY_CARE_PROVIDER_SITE_OTHER): Payer: Medicare Other | Admitting: Orthopedic Surgery

## 2022-06-15 DIAGNOSIS — M12811 Other specific arthropathies, not elsewhere classified, right shoulder: Secondary | ICD-10-CM | POA: Diagnosis not present

## 2022-06-16 ENCOUNTER — Encounter: Payer: Self-pay | Admitting: Orthopedic Surgery

## 2022-06-16 NOTE — Progress Notes (Signed)
Office Visit Note   Patient: Chase Sanchez           Date of Birth: 1960-09-15           MRN: [id number] Visit Date: 06/15/2022 Requested by: Alroy Dust, L.Marlou Sa, [place] [address] [employer] [address] [place],  [place] [address] PCP: Alroy Dust, L.Marlou Sa, MD  Subjective: Chief Complaint  Patient presents with   Other     Scan review    HPI: Chase Sanchez is a 61 y.o. male who presents to the office reporting bilateral shoulder pain right worse than left.  He reports significant pain in both shoulders which tends to just travel between the shoulder and the elbow and not really below the elbow.  He does report episodic numbness and tingling in digits 3 4 and 5 on both hands.  He does have history of diabetes in his hemoglobin A1c will be checked in several weeks.  Hard for him to sleep.  Has had prior surgery on both shoulders and now has painful rotator cuff arthropathy with no further options for soft tissue rotator cuff repair.  Since he was last seen he has had a preop thin cut CT scan which shows adequate bone stock for glenoid placement.  Patient also had MRI cervical spine which shows C3-C7 ACDF with patent spinal canal and there is some moderate neuroforaminal stenosis on the left at C3-4 and on the right at C7 and T1.  Importantly there is no right-sided foraminal stenosis seen which would account for radicular arm symptoms..                ROS: All systems reviewed are negative as they relate to the chief complaint within the history of present illness.  Patient denies fevers or chills.  Assessment & Plan: Visit Diagnoses: No diagnosis found.  Plan: Impression is right shoulder rotator cuff arthropathy.  Hemoglobin A1c likely elevated.  Purpose of the MRI scan was to make sure there is no residual nerve root compression on that right-hand side which could be accounting for some of his shoulder to elbow symptoms.  There is not on the right-hand side.  I do think with this information that it is  most likely that the pain generators on his right-hand side is predominantly from the shoulder.  The risk and benefits of reverse shoulder replacement are discussed with the patient including not limited to infection or vessel damage instability incomplete pain relief as well as some loss of function.  Patient has pretty reasonable strength at this time as well as good passive range of motion but pain is his biggest issue.  No definite clinical or exam evidence of infection in the shoulder.  Plan at this time is reverse shoulder replacement once his A1c can be confirmed to be less than 8.  He will let me know what that is after his medical checkup.  We can schedule surgery at that time.  Patient understands risk and benefits and agrees with the plan  Follow-Up Instructions: No follow-ups on file.   Orders:  No orders of the defined types were placed in this encounter.  No orders of the defined types were placed in this encounter.     Procedures: No procedures performed   Clinical Data: No additional findings.  Objective: Vital Signs: There were no vitals taken for this visit.  Physical Exam:  Constitutional: Patient appears well-developed HEENT:  Head: Normocephalic Eyes:EOM are normal Neck: Normal range of motion Cardiovascular: Normal rate Pulmonary/chest: Effort  normal Neurologic: Patient is alert Skin: Skin is warm Psychiatric: Patient has normal mood and affect  Ortho Exam: Ortho exam demonstrates intact EPL FPL interosseous strength in both hands.  Negative Tinel's cubital tunnel bilaterally.  Neck range of motion predictably diminished but nonpainful with rotation.  Deltoid is functional bilaterally.  On both sides the patient does have active forward flexion and abduction above 90 degrees.  Passive range of motion not restricted on the right with motion of 45/95/165.  There is some crepitus with internal/external rotation of that right arm at 90 degrees of internal/external  rotation.  No warmth or lymphadenopathy around the shoulder girdle or axilla.  Specialty Comments:  No specialty comments available.  Imaging: No results found.   PMFS History: Patient Active Problem List   Diagnosis Date Noted   HNP (herniated nucleus pulposus), cervical 03/27/2017   Chest pain 04/17/2011   Dyspnea 04/17/2011   Colon polyposis 12/25/2010   OTITIS MEDIA 08/30/2010   NEOPLASM OF UNCERTAIN BEHAVIOR OF SKIN 08/09/2010   SINUSITIS, ACUTE 08/09/2010   CAROTID BRUIT 08/09/2010   OBESITY 11/30/2009   ANXIETY 03/19/2009   UNSPEC VENTRAL HERN W/O MENTION OBST/GANGREN 03/31/2008   TOBACCO USE DISORDER/SMOKER-SMOKING CESSATION DISCUSSED 09/30/2007   DIABETES MELLITUS, TYPE II 07/07/2007   HYPOGONADISM 06/11/2007   HYPERLIPIDEMIA 06/11/2007   CORONARY ARTERY DISEASE 06/11/2007   GERD 06/11/2007   BARRETT'S ESOPHAGUS, HX OF 06/11/2007   Other acquired absence of organ 06/11/2007   Other postprocedural status(V45.89) 06/11/2007   Past Medical History:  Diagnosis Date   Anxiety    Arthritis    cervical   Benign localized prostatic hyperplasia with lower urinary tract symptoms (LUTS)    Bladder stone    Carpal tunnel syndrome on both sides    COPD (chronic obstructive pulmonary disease) (HCC)    Coronary artery disease    per cardiac cath 12-14-1999  mild insignigicant cad   Dupuytren's contracture of right hand    Familial multiple polyposis syndrome    s/p  subtotal colectomy 07-04-2011   Family history of diabetes mellitus    GERD (gastroesophageal reflux disease)    watches diet   History of cardiac murmur as a child    History of nuclear stress test 04/19/2011   normal with no ischemia/  normal LV function and wall motion , ef 67%   Hyperlipidemia    Hypogonadism male    Incomplete right bundle branch block    Mild obstructive sleep apnea    per pt study 05-16-2004 in epic,  very mild osa ,  no cpap recommendation   PONV (postoperative nausea and  vomiting)    Type 2 diabetes mellitus ([place])    followed by pcp   Ventral hernia, unspecified, without mention of obstruction or gangrene    Weak urinary stream     Family History  Problem Relation Age of Onset   Cancer Mother        lung   Cancer Father        prostate   Diabetes Other     Past Surgical History:  Procedure Laterality Date   ANTERIOR CERVICAL DECOMP/DISCECTOMY FUSION N/A 03/27/2017   Procedure: ANTERIOR CERVICAL DECOMPRESSION/DISCECTOMY FUSION CERVICAL FIVE- CERVICAL SIX, CERVICAL SIX- CERVICAL SEVEN;  Surgeon: Jovita Gamma, MD;  Location: [facility];  Service: Neurosurgery;  Laterality: N/A;   ANTERIOR CERVICAL DECOMP/DISCECTOMY FUSION N/A 06/15/2019   Procedure: ANTERIOR CERVICAL DECOMPRESSION/DISCECTOMY CERVICAL THREE- CERVICAL FOUR, CERVICAL FOUR- CERVICAL FIVE, REMOVAL OF AVIATOR PLATE;  Surgeon: Jovita Gamma,  MD;  Location: [facility];  Service: Neurosurgery;  Laterality: N/A;   APPENDECTOMY  age 10   BACK SURGERY  2018   anterior cervical spine   CARDIAC CATHETERIZATION  12/14/1999  '@[facility]'$    mild insignificant CAD,  normal LVSF, ef 65% (mLAD 25%,  OM2 25%,  PDA 25%)   COLON SURGERY     LAPAROSCOPIC SUBTOTAL COLECTOMY  07-04-2011  '@[facility]'$    w/  Ileorectal anatomsis (FAP)   ROTATOR CUFF REPAIR Bilateral right 09-18-2000;  left 11-25-2002  dr Tonita Cong  '@[facility]'$    SEPTOPLASTY  2004 aaprox.   SHOULDER ARTHROSCOPY WITH SUBACROMIAL DECOMPRESSION AND OPEN ROTATOR C Right 07/05/2016   Procedure: RIGHT SHOULDER ARTHROSCOPY WITH SUBACROMIAL DECOMPRESSION;  Surgeon: Susa Day, MD;  Location: [facility];  Service: Orthopedics;  Laterality: Right;   THULIUM LASER TURP (TRANSURETHRAL RESECTION OF PROSTATE) N/A 10/07/2018   Procedure: Marcelino Duster LASER TURP (TRANSURETHRAL RESECTION OF PROSTATE), HOLMIUM LASER LITHOPAXY;  Surgeon: Festus Aloe, MD;  Location: [facility];  Service: Urology;  Laterality: N/A;   TONSILLECTOMY  child   Social History   Occupational History    Not on file  Tobacco Use   Smoking status: Every Day    Packs/day: 1.00    Years: 40.00    Total pack years: 40.00    Types: Cigarettes   Smokeless tobacco: Never  Vaping Use   Vaping Use: Never used  Substance and Sexual Activity   Alcohol use: Yes    Comment: occasional beer/wine   Drug use: Never   Sexual activity: Yes

## 2022-07-16 DIAGNOSIS — Z23 Encounter for immunization: Secondary | ICD-10-CM | POA: Diagnosis not present

## 2022-07-16 DIAGNOSIS — E785 Hyperlipidemia, unspecified: Secondary | ICD-10-CM | POA: Diagnosis not present

## 2022-07-16 DIAGNOSIS — E1165 Type 2 diabetes mellitus with hyperglycemia: Secondary | ICD-10-CM | POA: Diagnosis not present

## 2022-08-07 DIAGNOSIS — E785 Hyperlipidemia, unspecified: Secondary | ICD-10-CM | POA: Diagnosis not present

## 2022-08-07 DIAGNOSIS — E1165 Type 2 diabetes mellitus with hyperglycemia: Secondary | ICD-10-CM | POA: Diagnosis not present

## 2022-10-08 DIAGNOSIS — E1165 Type 2 diabetes mellitus with hyperglycemia: Secondary | ICD-10-CM | POA: Diagnosis not present

## 2022-10-08 DIAGNOSIS — E785 Hyperlipidemia, unspecified: Secondary | ICD-10-CM | POA: Diagnosis not present

## 2022-10-16 DIAGNOSIS — E119 Type 2 diabetes mellitus without complications: Secondary | ICD-10-CM | POA: Diagnosis not present

## 2022-10-16 DIAGNOSIS — R519 Headache, unspecified: Secondary | ICD-10-CM | POA: Diagnosis not present

## 2022-10-16 DIAGNOSIS — F17200 Nicotine dependence, unspecified, uncomplicated: Secondary | ICD-10-CM | POA: Diagnosis not present

## 2022-10-16 DIAGNOSIS — J019 Acute sinusitis, unspecified: Secondary | ICD-10-CM | POA: Diagnosis not present

## 2022-11-19 DIAGNOSIS — E1165 Type 2 diabetes mellitus with hyperglycemia: Secondary | ICD-10-CM | POA: Diagnosis not present

## 2022-11-19 DIAGNOSIS — E785 Hyperlipidemia, unspecified: Secondary | ICD-10-CM | POA: Diagnosis not present

## 2023-01-15 DIAGNOSIS — E119 Type 2 diabetes mellitus without complications: Secondary | ICD-10-CM | POA: Diagnosis not present

## 2023-01-15 DIAGNOSIS — H52203 Unspecified astigmatism, bilateral: Secondary | ICD-10-CM | POA: Diagnosis not present

## 2023-01-29 ENCOUNTER — Telehealth: Payer: Self-pay | Admitting: *Deleted

## 2023-01-29 NOTE — Patient Outreach (Signed)
  Care Coordination   01/29/2023 Name: Chase Sanchez MRN: [id number] DOB: 1960-12-22   Care Coordination Outreach Attempts:  An unsuccessful telephone outreach was attempted today to offer the patient information about available care coordination services.  Follow Up Plan:  Additional outreach attempts will be made to offer the patient care coordination information and services.   Encounter Outcome:  No Answer   Care Coordination Interventions:  No, not indicated    Reece Levy, MSW, LCSW Clinical Social Worker [facility] [phone]

## 2023-02-04 ENCOUNTER — Telehealth: Payer: Self-pay | Admitting: *Deleted

## 2023-02-04 NOTE — Patient Outreach (Signed)
  Care Coordination   02/04/2023 Name: Chase Sanchez MRN: [id number] DOB: 10-Nov-1960   Care Coordination Outreach Attempts:  A second unsuccessful outreach was attempted today to offer the patient with information about available care coordination services.  Follow Up Plan:  Additional outreach attempts will be made to offer the patient care coordination information and services.   Encounter Outcome:  No Answer   Care Coordination Interventions:  No, not indicated    Reece Levy, MSW, LCSW Clinical Social Worker [facility] [phone]

## 2023-02-18 DIAGNOSIS — F17210 Nicotine dependence, cigarettes, uncomplicated: Secondary | ICD-10-CM | POA: Diagnosis not present

## 2023-02-18 DIAGNOSIS — E785 Hyperlipidemia, unspecified: Secondary | ICD-10-CM | POA: Diagnosis not present

## 2023-02-18 DIAGNOSIS — E1165 Type 2 diabetes mellitus with hyperglycemia: Secondary | ICD-10-CM | POA: Diagnosis not present

## 2023-03-05 DIAGNOSIS — R1310 Dysphagia, unspecified: Secondary | ICD-10-CM | POA: Diagnosis not present

## 2023-03-05 DIAGNOSIS — K219 Gastro-esophageal reflux disease without esophagitis: Secondary | ICD-10-CM | POA: Diagnosis not present

## 2023-03-05 DIAGNOSIS — K921 Melena: Secondary | ICD-10-CM | POA: Diagnosis not present

## 2023-03-05 DIAGNOSIS — K635 Polyp of colon: Secondary | ICD-10-CM | POA: Diagnosis not present

## 2023-03-06 DIAGNOSIS — G4733 Obstructive sleep apnea (adult) (pediatric): Secondary | ICD-10-CM | POA: Diagnosis not present

## 2023-03-25 DIAGNOSIS — I700 Atherosclerosis of aorta: Secondary | ICD-10-CM | POA: Diagnosis not present

## 2023-03-25 DIAGNOSIS — M5090 Cervical disc disorder, unspecified, unspecified cervical region: Secondary | ICD-10-CM | POA: Diagnosis not present

## 2023-03-25 DIAGNOSIS — Z1159 Encounter for screening for other viral diseases: Secondary | ICD-10-CM | POA: Diagnosis not present

## 2023-03-25 DIAGNOSIS — Z1211 Encounter for screening for malignant neoplasm of colon: Secondary | ICD-10-CM | POA: Diagnosis not present

## 2023-03-25 DIAGNOSIS — Z0000 Encounter for general adult medical examination without abnormal findings: Secondary | ICD-10-CM | POA: Diagnosis not present

## 2023-03-25 DIAGNOSIS — E7800 Pure hypercholesterolemia, unspecified: Secondary | ICD-10-CM | POA: Diagnosis not present

## 2023-03-25 DIAGNOSIS — G4733 Obstructive sleep apnea (adult) (pediatric): Secondary | ICD-10-CM | POA: Diagnosis not present

## 2023-03-25 DIAGNOSIS — K219 Gastro-esophageal reflux disease without esophagitis: Secondary | ICD-10-CM | POA: Diagnosis not present

## 2023-03-25 DIAGNOSIS — E1165 Type 2 diabetes mellitus with hyperglycemia: Secondary | ICD-10-CM | POA: Diagnosis not present

## 2023-03-26 ENCOUNTER — Encounter: Payer: Self-pay | Admitting: Emergency Medicine

## 2023-04-01 DIAGNOSIS — D128 Benign neoplasm of rectum: Secondary | ICD-10-CM | POA: Diagnosis not present

## 2023-04-01 DIAGNOSIS — Z86010 Personal history of colonic polyps: Secondary | ICD-10-CM | POA: Diagnosis not present

## 2023-04-01 DIAGNOSIS — Z09 Encounter for follow-up examination after completed treatment for conditions other than malignant neoplasm: Secondary | ICD-10-CM | POA: Diagnosis not present

## 2023-04-01 DIAGNOSIS — K2950 Unspecified chronic gastritis without bleeding: Secondary | ICD-10-CM | POA: Diagnosis not present

## 2023-04-01 DIAGNOSIS — K219 Gastro-esophageal reflux disease without esophagitis: Secondary | ICD-10-CM | POA: Diagnosis not present

## 2023-04-01 DIAGNOSIS — Z538 Procedure and treatment not carried out for other reasons: Secondary | ICD-10-CM | POA: Diagnosis not present

## 2023-04-01 DIAGNOSIS — R1310 Dysphagia, unspecified: Secondary | ICD-10-CM | POA: Diagnosis not present

## 2023-04-09 ENCOUNTER — Other Ambulatory Visit: Payer: Self-pay

## 2023-04-09 DIAGNOSIS — Z87891 Personal history of nicotine dependence: Secondary | ICD-10-CM

## 2023-04-09 DIAGNOSIS — F17210 Nicotine dependence, cigarettes, uncomplicated: Secondary | ICD-10-CM

## 2023-04-09 DIAGNOSIS — Z122 Encounter for screening for malignant neoplasm of respiratory organs: Secondary | ICD-10-CM

## 2023-04-15 ENCOUNTER — Encounter: Payer: Self-pay | Admitting: Acute Care

## 2023-04-15 DIAGNOSIS — K219 Gastro-esophageal reflux disease without esophagitis: Secondary | ICD-10-CM | POA: Diagnosis not present

## 2023-04-15 DIAGNOSIS — R1310 Dysphagia, unspecified: Secondary | ICD-10-CM | POA: Diagnosis not present

## 2023-04-15 DIAGNOSIS — Z86010 Personal history of colonic polyps: Secondary | ICD-10-CM | POA: Diagnosis not present

## 2023-05-02 DIAGNOSIS — E7800 Pure hypercholesterolemia, unspecified: Secondary | ICD-10-CM | POA: Diagnosis not present

## 2023-05-08 ENCOUNTER — Encounter: Payer: Medicare Other | Admitting: Acute Care

## 2023-05-08 ENCOUNTER — Ambulatory Visit: Payer: Medicare Other | Admitting: Nurse Practitioner

## 2023-05-08 DIAGNOSIS — F17200 Nicotine dependence, unspecified, uncomplicated: Secondary | ICD-10-CM

## 2023-05-08 NOTE — Patient Instructions (Signed)

## 2023-05-08 NOTE — Progress Notes (Signed)
Virtual Visit via Telephone Note  I connected with Chase Sanchez on 05/08/23 at  1:30 PM EDT by telephone and verified that I am speaking with the correct person using two identifiers.  Location:  remote Patient: Chase Sanchez Provider: Posey Boyer   Shared Decision Making Visit Lung Cancer Screening Program [phone])   Eligibility: Age 62 y.o. Pack Years Smoking History Calculation 35 (# packs/per year x # years smoked) Recent History of coughing up blood  no Unexplained weight loss? no ( >Than 15 pounds within the last 6 months ) Prior History Lung / other cancer no (Diagnosis within the last 5 years already requiring surveillance chest CT Scans).  Visit Components: Discussion included one or more decision making aids. yes Discussion included risk/benefits of screening. yes Discussion included potential follow up diagnostic testing for abnormal scans. yes Discussion included meaning and risk of over diagnosis. yes Discussion included meaning and risk of False Positives. yes Discussion included meaning of total radiation exposure. yes  Counseling Included: Importance of adherence to annual lung cancer LDCT screening. yes Impact of comorbidities on ability to participate in the program. yes Ability and willingness to under diagnostic treatment. yes  Smoking Cessation Counseling: Current Smokers:  Discussed importance of smoking cessation. yes Information about tobacco cessation classes and interventions provided to patient. yes Patient provided with "ticket" for LDCT Scan. N/a Symptomatic Patient. no  Counseling(Intermediate counseling: > three minutes) [id number] Diagnosis Code: Tobacco Use Z72.0 (CT Chest Lung Cancer Screening Low Dose W/O CM) [id number] Z12.2-Screening of respiratory organs Z87.891-Personal history of nicotine dependence   Posey Boyer, NP

## 2023-05-09 ENCOUNTER — Ambulatory Visit
Admission: RE | Admit: 2023-05-09 | Discharge: 2023-05-09 | Disposition: A | Discharge status: Home / Self Care | Payer: Medicare Other | Source: Ambulatory Visit | Attending: Acute Care | Admitting: Acute Care

## 2023-05-09 DIAGNOSIS — Z87891 Personal history of nicotine dependence: Secondary | ICD-10-CM

## 2023-05-09 DIAGNOSIS — Z122 Encounter for screening for malignant neoplasm of respiratory organs: Secondary | ICD-10-CM

## 2023-05-09 DIAGNOSIS — F17210 Nicotine dependence, cigarettes, uncomplicated: Secondary | ICD-10-CM

## 2023-05-21 ENCOUNTER — Telehealth: Payer: Self-pay | Admitting: Acute Care

## 2023-05-21 DIAGNOSIS — F17210 Nicotine dependence, cigarettes, uncomplicated: Secondary | ICD-10-CM

## 2023-05-21 DIAGNOSIS — Z122 Encounter for screening for malignant neoplasm of respiratory organs: Secondary | ICD-10-CM

## 2023-05-21 DIAGNOSIS — Z87891 Personal history of nicotine dependence: Secondary | ICD-10-CM

## 2023-05-21 NOTE — Telephone Encounter (Signed)
Returned call to patient who is requesting results of LDCT. Results not yet available. Will update patient once results are released.  Patient verbalized understanding.

## 2023-05-24 NOTE — Telephone Encounter (Signed)
Patient called in requesting results. Called and advised pt the result have not come back yet but we will call him once the results come through. Patient verbalized understanding.

## 2023-05-27 NOTE — Telephone Encounter (Signed)
Called and spoke with pt regarding LDCT results. Informed him of the Lung-RADS 2 finding and the recommendations for a 12 month follow up scan. Also informed him of the presence of emphysema, pt is aware of this. Also mentioned CAD and pt states he is on a statin for cholesterol and routinely follows up with his PCP regarding labs and dosage. Annual scan order placed. Results sent to PCP.     IMPRESSION: 1. Lung-RADS 2, benign appearance or behavior. Continue annual screening with low-dose chest CT without contrast in 12 months. 2.  Emphysema (ICD10-J43.9) and Aortic Atherosclerosis (ICD10-170.0)     Electronically Signed   By: Kennith Center M.D.   On: 05/25/2023 10:46

## 2023-05-27 NOTE — Addendum Note (Signed)
Addended by: Sheran Luz on: 05/27/2023 08:32 AM   Modules accepted: Orders

## 2023-05-29 DIAGNOSIS — G4733 Obstructive sleep apnea (adult) (pediatric): Secondary | ICD-10-CM | POA: Diagnosis not present

## 2023-06-21 DIAGNOSIS — E1165 Type 2 diabetes mellitus with hyperglycemia: Secondary | ICD-10-CM | POA: Diagnosis not present

## 2023-06-21 DIAGNOSIS — E785 Hyperlipidemia, unspecified: Secondary | ICD-10-CM | POA: Diagnosis not present

## 2023-06-28 DIAGNOSIS — E1169 Type 2 diabetes mellitus with other specified complication: Secondary | ICD-10-CM | POA: Diagnosis not present

## 2023-06-28 DIAGNOSIS — F17210 Nicotine dependence, cigarettes, uncomplicated: Secondary | ICD-10-CM | POA: Diagnosis not present

## 2023-06-28 DIAGNOSIS — E1165 Type 2 diabetes mellitus with hyperglycemia: Secondary | ICD-10-CM | POA: Diagnosis not present

## 2023-06-28 DIAGNOSIS — E785 Hyperlipidemia, unspecified: Secondary | ICD-10-CM | POA: Diagnosis not present

## 2023-07-25 ENCOUNTER — Other Ambulatory Visit (HOSPITAL_COMMUNITY): Payer: Self-pay

## 2023-07-25 MED ORDER — MOUNJARO 5 MG/0.5ML ~~LOC~~ SOAJ
5.0000 mg | SUBCUTANEOUS | 1 refills | Status: AC
  Filled 2023-07-25 – 2023-07-26 (×3): qty 2, 28d supply, fill #0

## 2023-07-26 ENCOUNTER — Other Ambulatory Visit: Payer: Self-pay

## 2023-07-26 ENCOUNTER — Other Ambulatory Visit (HOSPITAL_COMMUNITY): Payer: Self-pay

## 2023-07-29 ENCOUNTER — Other Ambulatory Visit: Payer: Self-pay

## 2023-07-29 MED ORDER — MOUNJARO 2.5 MG/0.5ML ~~LOC~~ SOAJ
2.5000 mg | SUBCUTANEOUS | 1 refills | Status: AC
  Filled 2023-07-29: qty 2, 28d supply, fill #0
  Filled 2023-09-02: qty 2, 28d supply, fill #1

## 2023-07-30 ENCOUNTER — Other Ambulatory Visit: Payer: Self-pay

## 2023-09-02 ENCOUNTER — Other Ambulatory Visit: Payer: Self-pay

## 2023-09-03 ENCOUNTER — Other Ambulatory Visit: Payer: Self-pay

## 2023-09-03 MED ORDER — MOUNJARO 5 MG/0.5ML ~~LOC~~ SOAJ
5.0000 mg | SUBCUTANEOUS | 3 refills | Status: DC
  Filled 2023-09-03: qty 2, 28d supply, fill #0
  Filled 2023-10-07: qty 2, 28d supply, fill #1
  Filled 2023-11-06: qty 2, 28d supply, fill #2
  Filled 2023-12-03: qty 2, 28d supply, fill #3

## 2023-09-11 ENCOUNTER — Other Ambulatory Visit: Payer: Self-pay

## 2023-10-07 ENCOUNTER — Other Ambulatory Visit: Payer: Self-pay

## 2023-10-09 ENCOUNTER — Other Ambulatory Visit: Payer: Self-pay

## 2023-11-11 ENCOUNTER — Other Ambulatory Visit: Payer: Self-pay

## 2023-12-09 ENCOUNTER — Other Ambulatory Visit: Payer: Self-pay

## 2024-01-06 ENCOUNTER — Other Ambulatory Visit: Payer: Self-pay

## 2024-01-10 ENCOUNTER — Other Ambulatory Visit: Payer: Self-pay

## 2024-01-10 MED ORDER — MOUNJARO 5 MG/0.5ML ~~LOC~~ SOAJ
5.0000 mg | SUBCUTANEOUS | 3 refills | Status: AC
  Filled 2024-01-10: qty 2, 28d supply, fill #0

## 2024-01-13 ENCOUNTER — Other Ambulatory Visit: Payer: Self-pay

## 2024-05-12 ENCOUNTER — Ambulatory Visit
Admission: RE | Admit: 2024-05-12 | Discharge: 2024-05-12 | Disposition: A | Discharge status: Home / Self Care | Source: Ambulatory Visit

## 2024-05-12 DIAGNOSIS — F17210 Nicotine dependence, cigarettes, uncomplicated: Secondary | ICD-10-CM

## 2024-05-12 DIAGNOSIS — Z87891 Personal history of nicotine dependence: Secondary | ICD-10-CM

## 2024-05-12 DIAGNOSIS — Z122 Encounter for screening for malignant neoplasm of respiratory organs: Secondary | ICD-10-CM

## 2024-05-14 ENCOUNTER — Other Ambulatory Visit: Payer: Self-pay

## 2024-05-14 DIAGNOSIS — Z122 Encounter for screening for malignant neoplasm of respiratory organs: Secondary | ICD-10-CM

## 2024-05-14 DIAGNOSIS — Z87891 Personal history of nicotine dependence: Secondary | ICD-10-CM

## 2024-05-14 DIAGNOSIS — F17210 Nicotine dependence, cigarettes, uncomplicated: Secondary | ICD-10-CM

## 2024-08-03 ENCOUNTER — Telehealth: Payer: Self-pay | Admitting: Acute Care

## 2024-08-03 NOTE — Telephone Encounter (Signed)
 Returned call from VM but no answer. Left VM and our call back number.  Patient did not leave reason for call
